# Patient Record
Sex: Male | Born: 1935 | Race: Black or African American | Hispanic: No | Smoking: Former smoker
Health system: Southern US, Community
[De-identification: ages and names within clinical notes are randomized; demographics above are authoritative.]

## PROBLEM LIST (undated history)

## (undated) DIAGNOSIS — I10 Essential (primary) hypertension: Secondary | ICD-10-CM

## (undated) DIAGNOSIS — E079 Disorder of thyroid, unspecified: Secondary | ICD-10-CM

## (undated) DIAGNOSIS — E7800 Pure hypercholesterolemia, unspecified: Secondary | ICD-10-CM

## (undated) DIAGNOSIS — M109 Gout, unspecified: Secondary | ICD-10-CM

## (undated) HISTORY — PX: ABDOMINAL SURGERY: SHX537

---

## 1998-08-25 ENCOUNTER — Ambulatory Visit (HOSPITAL_BASED_OUTPATIENT_CLINIC_OR_DEPARTMENT_OTHER): Admission: RE | Admit: 1998-08-25 | Discharge: 1998-08-25 | Payer: Self-pay | Admitting: Orthopedic Surgery

## 1999-03-04 ENCOUNTER — Encounter (HOSPITAL_BASED_OUTPATIENT_CLINIC_OR_DEPARTMENT_OTHER): Payer: Self-pay | Admitting: General Surgery

## 1999-03-04 ENCOUNTER — Inpatient Hospital Stay (HOSPITAL_COMMUNITY): Admission: EM | Admit: 1999-03-04 | Discharge: 1999-03-08 | Payer: Self-pay | Admitting: General Surgery

## 1999-07-14 ENCOUNTER — Emergency Department (HOSPITAL_COMMUNITY): Admission: EM | Admit: 1999-07-14 | Discharge: 1999-07-14 | Payer: Self-pay | Admitting: Internal Medicine

## 2000-01-26 ENCOUNTER — Emergency Department (HOSPITAL_COMMUNITY): Admission: EM | Admit: 2000-01-26 | Discharge: 2000-01-26 | Payer: Self-pay | Admitting: Internal Medicine

## 2000-03-01 ENCOUNTER — Emergency Department (HOSPITAL_COMMUNITY): Admission: EM | Admit: 2000-03-01 | Discharge: 2000-03-01 | Payer: Self-pay | Admitting: Emergency Medicine

## 2000-03-01 ENCOUNTER — Encounter: Payer: Self-pay | Admitting: Emergency Medicine

## 2000-03-28 ENCOUNTER — Encounter: Payer: Self-pay | Admitting: Vascular Surgery

## 2000-03-29 ENCOUNTER — Ambulatory Visit: Admission: RE | Admit: 2000-03-29 | Discharge: 2000-03-29 | Payer: Self-pay | Admitting: Vascular Surgery

## 2000-04-14 ENCOUNTER — Encounter: Payer: Self-pay | Admitting: Vascular Surgery

## 2000-04-17 ENCOUNTER — Inpatient Hospital Stay (HOSPITAL_COMMUNITY): Admission: RE | Admit: 2000-04-17 | Discharge: 2000-04-21 | Payer: Self-pay | Admitting: Vascular Surgery

## 2000-04-17 ENCOUNTER — Encounter: Payer: Self-pay | Admitting: Vascular Surgery

## 2000-04-17 ENCOUNTER — Encounter (INDEPENDENT_AMBULATORY_CARE_PROVIDER_SITE_OTHER): Payer: Self-pay

## 2000-04-18 ENCOUNTER — Encounter: Payer: Self-pay | Admitting: Vascular Surgery

## 2002-06-12 ENCOUNTER — Ambulatory Visit (HOSPITAL_COMMUNITY): Admission: RE | Admit: 2002-06-12 | Discharge: 2002-06-12 | Payer: Self-pay | Admitting: Dentistry

## 2002-06-12 ENCOUNTER — Encounter (INDEPENDENT_AMBULATORY_CARE_PROVIDER_SITE_OTHER): Payer: Self-pay | Admitting: *Deleted

## 2003-02-12 ENCOUNTER — Emergency Department (HOSPITAL_COMMUNITY): Admission: EM | Admit: 2003-02-12 | Discharge: 2003-02-12 | Payer: Self-pay | Admitting: Emergency Medicine

## 2003-02-12 ENCOUNTER — Encounter: Payer: Self-pay | Admitting: Emergency Medicine

## 2007-03-17 ENCOUNTER — Encounter: Admission: RE | Admit: 2007-03-17 | Discharge: 2007-03-17 | Payer: Self-pay | Admitting: Internal Medicine

## 2008-03-17 ENCOUNTER — Emergency Department (HOSPITAL_COMMUNITY): Admission: EM | Admit: 2008-03-17 | Discharge: 2008-03-17 | Payer: Self-pay | Admitting: Emergency Medicine

## 2008-12-20 ENCOUNTER — Emergency Department (HOSPITAL_COMMUNITY): Admission: EM | Admit: 2008-12-20 | Discharge: 2008-12-20 | Payer: Self-pay | Admitting: Emergency Medicine

## 2008-12-31 ENCOUNTER — Emergency Department (HOSPITAL_COMMUNITY): Admission: EM | Admit: 2008-12-31 | Discharge: 2008-12-31 | Payer: Self-pay | Admitting: Emergency Medicine

## 2011-04-22 NOTE — Op Note (Signed)
[facility]  Patient:    Earl Riley, Earl Riley                        MRN: [id number] Proc. Date: 04/17/00 Adm. Date:  [id number] Attending:  Alyson Locket CC:         Anselmo Rod, M.D.                           Operative Report  PREOPERATIVE DIAGNOSES:  Abdominal aortic aneurysm, bilateral common iliac artery aneurysm.  POSTOPERATIVE DIAGNOSES:  Abdominal aortic aneurysm, bilateral common iliac artery aneurysm.  OPERATION: 1. Aorto bi-external iliac artery bypass with a 16 x 8 Hemashield graft. 2. Ligation of internal iliac arteries bilaterally. 3. Reimplantation of accessory left renal artery. 4. Reimplantation of inferior mesenteric artery.  SURGEON:  Larina Earthly, M.D.  ASSISTANT:  Di Kindle. Edilia Bo, M.D., Lissa Merlin, P.A.C.  ANESTHESIA:  General endotracheal.  COMPLICATIONS:  None.  DISPOSITION:  To recovery when stable.  DESCRIPTION OF PROCEDURE:  The patient was taken to the operating room and placed in position where the area of the abdomen and both groins prepped and draped in the usual sterile fashion.  An incision was made from the level of the xiphoid to pubis, carried down through the midline fascia with electrocautery.  The abdomen was explored.  There was adhesions in the right lower quadrant of the sigmoid into the right lower quadrant.  These were all mobilized.  Omni-Tract retractor was used for exposure.  The transverse colon and antrum reflexed superiorly and the small bowel reflected to the right. The aorta was encircled below the level of the left renal vein.  The patient had a large accessory left renal artery, which was preserved.  The patient also had a large patent inferior mesenteric artery.  Dissection was continued down to the bifurcation.  The common iliac arteries were aneurysmal bilaterally and these extended with large aneurysms into the internal iliac arteries bilaterally.  The external iliac arteries  were normal.  The external iliac arteries were encircled with vessel loops bilaterally.  The patient was given 8000 units of intravenous heparin, 25 g of mannitol.  After adequate circulation time, the aorta was occluded below the level of the renal arteries and above the left accessory renal artery with a Henley clamp.  The internal iliac arteries were ligated bilaterally below the level of the aneurysmal change.  The external iliac arteries were occluded with Henley clamps.  The aorta was opened with electrocautery and lumbar back bleeding was controlled with a 2-0 silk figure-of-eight stitches.  The aorta was transected below the proximal clamp and a 16 x 8 Hemashield graft was brought onto the field and was sewn end-to-end to the aorta with a running 3-0 Prolene suture using a felt strip for reinforcement.  The iliac anastomosis was end-to-end to the external iliac artery bilaterally with 5-0 Prolene sutures.  The usual flushing maneuvers were completed before each anastomosis.  Flow was restored to the lower extremities.  Next, due to ligating the internal iliac arteries, the decision was made to re-implant the inferior mesenteric artery into the graft itself.  This was done by partially occluding the graft with a Satinsky clamp and a ellipse of graft was removed.  The inferior mesenteric artery was sewn end-to-side of the graft with a running 6-0 Prolene suture.  Clamps removed and good flow was noted.  Next, the accessory  left renal artery was approached and was mobilized and was sewn end-to-side to the aortic graft with a running 6-0 Prolene suture.  The clamps were removed and good flow was noted with Doppler signal was noted in the accessory renal and the inferior mesenteric artery.  The patient was given 50 mg of Protamine for reversal of the heparin.  The wounds were irrigated with saline and hemostasis obtained with electrocautery.  The wounds were closed by re-closing the wall  of the aorta over the aortic graft with a running 2-0 Vicryl suture.  Next, the retroperitoneum was closed with a running 2-0 Vicryl suture to exclude the graft from the bowel.  The small bowel was run in its entirety without injury.  It was placed back in the pelvis.  Transverse colon and omentum placed over this.  The midline fascia was closed with #1 PDS suture beginning proximally and distally and tying in the middle.  The skin was closed with skin clips.  A sterile dressing was applied and the patient was taken to the recovery room and extubated in stable condition. DD:  04/17/00 TD:  04/19/00 Job: [id number]

## 2011-04-22 NOTE — Discharge Summary (Signed)
[facility]  Patient:    Earl Riley, Earl Riley                        MRN: [id number] Adm. Date:  [id number] Disc. Date: [id number] Attending:  Alyson Locket Dictator:   Lissa Merlin, P.A. CC:         Larina Earthly, M.D.             Anselmo Rod, M.D.                           Discharge Summary  DATE OF BIRTH:  Nov 14, 1928  SURGEON:  Larina Earthly, M.D.  PRIMARY MEDICAL DOCTOR:  Anselmo Rod, M.D.  ADMISSION DIAGNOSIS: 1. Newly diagnosed 5 cm infrarenal abdominal aortic aneurysm recently    discovered incidentally. 2. Bilateral common iliac artery aneurysms.  PAST MEDICAL HISTORY:  As above.  In addition: 1. History of prostatitis. 2. History of diverticulosis. 3. Gout. 4. Right knee arthritis.  DISCHARGE DIAGNOSES:  Status post ______ of abdominal aortic aneurysm on Apr 17, 2000.  PROCEDURES:  As above.  BRIEF HISTORY:  This is a pleasant 77 year old black male referred by Dr. Loreta Ave for evaluation of AAA.  He initially presented to the [facility] Emergency Department on March 30, 2000 complaining of a "strain."  CT scan at the time revealed an incidental 5 cm infrarenal abdominal aortic aneurysm.  He was referred to Dr. Arbie Cookey for further evaluation.  A follow-up abdominal arteriogram confirmed the AAA as well as bilateral common iliac artery aneurysms which on the right extended into the hypogastric at the takeoff.  It showed a normal femoral arteries and bilateral runoff.  After evaluation by Dr. Arbie Cookey he recommended repair and grafting of the AAA which was scheduled for Apr 17, 2000.  This was after discussion by Dr. Arbie Cookey with Mr. Sivertsen of risks, benefits, details, and alternatives to surgery.  It was agreed to proceed.  Prior to his admission Mr. Mohon denied abdominal pain, nausea, vomiting, diaphoresis, and other abdominal symptoms.  He had no constipation, leg pain, or claudication symptoms.  He was brought into the  hospital for the elective procedure as scheduled on Apr 17, 2000.  He underwent the procedure without obvious complications and was transferred to the recovery room in stable condition.  Postoperative day # 1 he was extubated and observed to be stable.  His vital signs were stable.  He had good saturation on 2 L of O2. Had 2+ dorsalis pedis pulses bilaterally.  He continued to progress well without any notable postoperative complications except some mild nausea postoperative day # 3 which resolved.  He was advanced in his diet and was walking without assistance.  His bowels and bladder were functioning normally. His wound is healing well.  On Apr 21, 2000 he is awake, alert, smiling, and feels well.  He reports he had a good night last night.  He has no more nausea or vomiting.  He was able to keep his supper down last night.  He has been ambulating about the room without assistance.  Latest vital signs show he is afebrile and hemodynamically stable with O2 saturation 91% on room air. Laboratory work is satisfactory this morning.  PHYSICAL EXAMINATION:  LUNGS:  Clear.  HEART:  Regular rate and rhythm.  ABDOMEN:  Soft, nondistended.  Positive bowel sounds.  Incision healing well.  EXTREMITIES:  Dorsalis  pedis pulses 2+ bilaterally with both legs warm.  He is noted to be doing very well today and after breakfast this morning if he continues to do well, he will be discharged home.  MEDICATIONS:  He will resume his home medications which include: 1. Colchicine 0.6 mg b.i.d. 2. Indomethacin 75 mg p.r.n. 3. He will have Tylox for pain one to two p.o. q.4-6h. p.r.n.  ALLERGIES:  No known drug allergies.  SPECIAL INSTRUCTIONS:  Mr. Furey is to resume a low fat, low cholesterol diet.  He is to avoid strenuous activity.  No driving.  He is told to walk daily and to use his incentive spirometer daily.  He can shower.  He is to watch his incision for signs of infection such as redness,  discharge, swelling, and to call the office if he notices any changes.  FOLLOW-UP: 1. Mr. Tavano will return to [facility] office in around two weeks for staple    removal. 2. He will see Dr. Arbie Cookey in around three weeks. DD:  75/18/01 TD:  04/24/00 Job: [id number]

## 2011-04-22 NOTE — Procedures (Signed)
[place]  Patient:    Earl Riley, Earl Riley Visit Number: [id number] MRN: [id number]          Service Type: END Location: ENDO Attending Physician:  Charna Elizabeth Dictated by:   Anselmo Rod, M.D. Proc. Date: 06/12/02 Admit Date:  06/12/2002 Discharge Date: 06/12/2002   CC:         Luisa Hart L. Lurene Shadow, M.D.   Procedure Report  DATE OF BIRTH:  October 24, 1928  PROCEDURE PERFORMED:  Colonoscopy with biopsy x1.  ENDOSCOPIST:  Anselmo Rod, M.D.  INSTRUMENT USED:  Olympus video colonoscope.  INDICATIONS FOR PROCEDURE:  77 year old male with a history of adenomatous polyps removed in the past.  Rule out recurrent polyps.  PREPROCEDURE PREPARATION:  Informed consent was procured from the patient. The patient was fasted for eight hours prior to the procedure and was prepped with a bottle of magnesium citrate and a gallon of NuLYTELY the night prior to the procedure.  PREPROCEDURE PHYSICAL:  VITAL SIGNS:  The patient had stable vital signs.  NECK:  Supple.  LUNGS:  Chest was clear to auscultation.  CARDIAC:  S1, S2 regular.  ABDOMEN:  Abdomen soft with normal bowel sounds.  DESCRIPTION OF PROCEDURE:  The patient was placed in the left lateral decubitus position and sedated with 66 mg of Demerol and 6.5 mg of Versed intravenously.  Once the patient was adequately sedated and maintained on low-flow oxygen and continuous cardiac monitoring, the Olympus video colonoscope was advanced from the rectum to the cecum with slight difficulty, secondary to some residual stool in the colon.  Multiple washings were done. A small sessile polyp was biopsied from the proximal right colon.  There were a few scattered diverticula with more prominent changes in the right than the left colon with regards to diverticular disease.  Small internal hemorrhoids were appreciated on retroflexion in the rectum.  IMPRESSION: 1. Small sessile polyp, biopsied from  proximal right colon. 2. Scattered diverticulosis with more prominent changes in the right colon    than the left colon. 3. Small nonbleeding internal hemorrhoids. 4. Normal-appearing terminal ileum.  RECOMMENDATIONS: 1. Await pathology results. 2. High-fiber diet has been recommended for the patient with liberal    fluid intake. 3. Outpatient followup in the next two weeks. Dictated by:   Anselmo Rod, M.D. Attending Physician:  Charna Elizabeth DD:  06/12/02 TD:  06/16/02 Job: [id number]

## 2011-04-22 NOTE — H&P (Signed)
[facility]  Patient:    Earl Riley, Earl Riley                          MRN: [id number] Adm. Date:  04/17/00 Attending:  Larina Earthly, M.D. Dictator:   Marlowe Kays, P.A. CC:         Anselmo Rod, M.D.                         History and Physical  DATE OF BIRTH:  09/02/2028.  REFERRING PHYSICIAN:  Anselmo Rod, M.D.  CHIEF COMPLAINT:  Abdominal aortic aneurysm.  HISTORY OF PRESENT ILLNESS:  A 77 year old white male referred by Dr. Loreta Ave for evaluation of AAA.  The patient presented to the [facility] ED on March 30, 2000, complaining of "strain."  A CT scan revealed an incidental 5 cm infrarenal AAA.  He then was referred to Dr. Arbie Cookey for further evaluation. Follow-up abdominal arteriogram confirmed the AAA, as well as bilateral CIA aneurysm, which on the right extends into the hypogastric at the takeoff.  He also shows normal femorals and bilateral runoff.  Upon evaluation, Dr. Arbie Cookey recommended repair and graft of the AAA, which is scheduled for Apr 17, 2000. Prior to the admission, the patient denies any abdominal pain, nausea, vomiting, diaphoresis, hematochezia, diarrhea, melena, fever, or chills.  No back pain, constipation, leg pain, claudication symptoms, hematemesis, shortness of breath, or dyspnea on exertion.  PAST MEDICAL HISTORY:  History of prostatitis, history of diverticulosis, gout, and arthritis of the right knee.  PAST SURGICAL HISTORY:  Status post aortogram with bilateral lower extremity runoff by Dr. Arbie Cookey on March 29, 2000.  MEDICATIONS:  Colchicine 0.6 mg p.o. b.i.d., indomethacin 75 mg one p.o. p.r.n.  ALLERGIES:  No known drug allergies.  REVIEW OF SYSTEMS:  See HPI and past medical history for significant positives.  The patient denies any diabetes, kidney disease, asthma, COPD, TIA, CVA, amaurosis fugax, syncope, presyncope, CAD, angina, arrhythmia, PE, DVT, GI bleed, dysuria, hematuria, GERD, CHF, or  hypertension.  FAMILY HISTORY:  Significant for mother, who died at 3 of hypertension, and father died at 25 of "old age."  He has two brothers and two sisters, alive and well.  SOCIAL HISTORY:  The patient is widowed for three years, he has no children. 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  He denies any tobacco intake, he quit 30 years ago one-half pack of cigarettes for 20 years of use.  No alcohol intake.  PHYSICAL EXAMINATION:  GENERAL:  Well-developed, well-nourished 77 year old white male in no acute distress.  Alert and oriented x 3.  VITAL SIGNS:  Blood pressure 140/90 on the left, 140/92 on the right, pulse 64, respirations 18.  HEENT:  Normocephalic, atraumatic, PERRLA, EOMI, bilateral, _____ funduscopic exam.  The patient was wears glasses.  NECK:  Supple, no JVD, bruits, thyromegaly, or lymphadenopathy.  CHEST:  Symmetric on inspiration.  Lungs clear to auscultation bilaterally, decreased breath sounds at the bases.  CARDIOVASCULAR:  Regular rate and rhythm without murmurs, rubs, or gallops.  ABDOMEN:  Soft, nontender, bowel sounds x 4, no hepatosplenomegaly.  There is a nontender pulsatile mass, with a 1/4 bruit, in the periumbilical area.  GU/RECTAL:  Deferred.  EXTREMITIES:  No clubbing, cyanosis, or edema.  SKIN:  No ulcerations, normal capillary pattern, warm temperature.  PULSES:  Carotids 2+ bilaterally without bruits, femorals 2+ bilaterally, popliteal, dorsalis pedis, and  posterior tibial are 2+ bilaterally.  NEUROLOGIC:  Grossly normal, normal gait.  DTRs 2+ bilaterally.  Muscle strength 5/5.  ASSESSMENT AND PLAN:  A 77 year old black male with a newly diagnosed abdominal aortic aneurysm, who will undergo repair and graft of the abdominal aortic aneurysm with Dr. Arbie Cookey on Apr 17, 2000.   Dr. Arbie Cookey has seen and evaluated this patient prior to the admission.  He has explained the risks and benefits involving the procedure, and the patient  has agreed to continue. DD:  04/20/00 TD:  04/20/00 Job: [id number]

## 2011-08-30 LAB — CBC
HCT: 42.7
Hemoglobin: 14.2
MCHC: 33.1
RDW: 13.7

## 2011-08-30 LAB — DIFFERENTIAL
Basophils Absolute: 0.1
Basophils Relative: 0
Monocytes Relative: 8
Neutro Abs: 8.5 — ABNORMAL HIGH
Neutrophils Relative %: 71

## 2011-08-30 LAB — COMPREHENSIVE METABOLIC PANEL
Alkaline Phosphatase: 94
BUN: 15
CO2: 24
GFR calc non Af Amer: 41 — ABNORMAL LOW
Glucose, Bld: 136 — ABNORMAL HIGH
Potassium: 3.8
Total Protein: 8.4 — ABNORMAL HIGH

## 2011-08-30 LAB — OCCULT BLOOD X 1 CARD TO LAB, STOOL: Fecal Occult Bld: POSITIVE

## 2011-09-01 ENCOUNTER — Emergency Department (HOSPITAL_COMMUNITY)
Admission: EM | Admit: 2011-09-01 | Discharge: 2011-09-01 | Disposition: A | Discharge status: Home / Self Care | Payer: Medicare Other | Attending: Emergency Medicine | Admitting: Emergency Medicine

## 2011-09-01 ENCOUNTER — Emergency Department (HOSPITAL_COMMUNITY): Payer: Medicare Other

## 2011-09-01 DIAGNOSIS — I10 Essential (primary) hypertension: Secondary | ICD-10-CM | POA: Insufficient documentation

## 2011-09-01 DIAGNOSIS — IMO0002 Reserved for concepts with insufficient information to code with codable children: Secondary | ICD-10-CM | POA: Insufficient documentation

## 2011-09-01 DIAGNOSIS — E7800 Pure hypercholesterolemia, unspecified: Secondary | ICD-10-CM | POA: Insufficient documentation

## 2011-09-01 DIAGNOSIS — E039 Hypothyroidism, unspecified: Secondary | ICD-10-CM | POA: Insufficient documentation

## 2011-09-01 LAB — CBC
MCV: 95.1 fL (ref 78.0–100.0)
Platelets: 202 10*3/uL (ref 150–400)
RBC: 4.07 MIL/uL — ABNORMAL LOW (ref 4.22–5.81)
WBC: 6.6 10*3/uL (ref 4.0–10.5)

## 2011-10-25 ENCOUNTER — Other Ambulatory Visit: Payer: Self-pay | Admitting: Gastroenterology

## 2015-08-21 ENCOUNTER — Emergency Department (HOSPITAL_COMMUNITY): Payer: Medicare HMO

## 2015-08-21 ENCOUNTER — Encounter (HOSPITAL_COMMUNITY): Payer: Self-pay | Admitting: *Deleted

## 2015-08-21 ENCOUNTER — Observation Stay (HOSPITAL_COMMUNITY)
Admission: EM | Admit: 2015-08-21 | Discharge: 2015-08-22 | Disposition: A | Discharge status: Home / Self Care | Payer: Medicare HMO | Attending: Emergency Medicine | Admitting: Emergency Medicine

## 2015-08-21 DIAGNOSIS — E780 Pure hypercholesterolemia: Secondary | ICD-10-CM | POA: Insufficient documentation

## 2015-08-21 DIAGNOSIS — M109 Gout, unspecified: Secondary | ICD-10-CM | POA: Insufficient documentation

## 2015-08-21 DIAGNOSIS — R079 Chest pain, unspecified: Principal | ICD-10-CM | POA: Insufficient documentation

## 2015-08-21 DIAGNOSIS — E079 Disorder of thyroid, unspecified: Secondary | ICD-10-CM | POA: Diagnosis not present

## 2015-08-21 DIAGNOSIS — E785 Hyperlipidemia, unspecified: Secondary | ICD-10-CM | POA: Diagnosis not present

## 2015-08-21 DIAGNOSIS — I10 Essential (primary) hypertension: Secondary | ICD-10-CM | POA: Diagnosis not present

## 2015-08-21 DIAGNOSIS — R072 Precordial pain: Secondary | ICD-10-CM | POA: Diagnosis not present

## 2015-08-21 HISTORY — DX: Disorder of thyroid, unspecified: E07.9

## 2015-08-21 HISTORY — DX: Gout, unspecified: M10.9

## 2015-08-21 HISTORY — DX: Pure hypercholesterolemia, unspecified: E78.00

## 2015-08-21 HISTORY — DX: Essential (primary) hypertension: I10

## 2015-08-21 LAB — BASIC METABOLIC PANEL
Anion gap: 7 (ref 5–15)
BUN: 15 mg/dL (ref 6–20)
CALCIUM: 9.7 mg/dL (ref 8.9–10.3)
CO2: 25 mmol/L (ref 22–32)
CREATININE: 1.49 mg/dL — AB (ref 0.61–1.24)
Chloride: 106 mmol/L (ref 101–111)
GFR calc non Af Amer: 41 mL/min — ABNORMAL LOW (ref >60)
GFR, EST AFRICAN AMERICAN: 48 mL/min — AB (ref >60)
GLUCOSE: 95 mg/dL (ref 65–99)
Potassium: 4.2 mmol/L (ref 3.5–5.1)
Sodium: 138 mmol/L (ref 135–145)

## 2015-08-21 LAB — URINALYSIS, ROUTINE W REFLEX MICROSCOPIC
BILIRUBIN URINE: NEGATIVE
GLUCOSE, UA: NEGATIVE mg/dL
HGB URINE DIPSTICK: NEGATIVE
Ketones, ur: NEGATIVE mg/dL
Nitrite: NEGATIVE
PH: 6 (ref 5.0–8.0)
Protein, ur: NEGATIVE mg/dL
SPECIFIC GRAVITY, URINE: 1.008 (ref 1.005–1.030)
Urobilinogen, UA: 0.2 mg/dL (ref 0.0–1.0)

## 2015-08-21 LAB — URINE MICROSCOPIC-ADD ON

## 2015-08-21 LAB — CBC
HCT: 40 % (ref 39.0–52.0)
Hemoglobin: 13.1 g/dL (ref 13.0–17.0)
MCH: 31 pg (ref 26.0–34.0)
MCHC: 32.8 g/dL (ref 30.0–36.0)
MCV: 94.6 fL (ref 78.0–100.0)
PLATELETS: 154 10*3/uL (ref 150–400)
RBC: 4.23 MIL/uL (ref 4.22–5.81)
RDW: 13.4 % (ref 11.5–15.5)
WBC: 6.3 10*3/uL (ref 4.0–10.5)

## 2015-08-21 LAB — I-STAT TROPONIN, ED
TROPONIN I, POC: 0 ng/mL (ref 0.00–0.08)
Troponin i, poc: 0 ng/mL (ref 0.00–0.08)

## 2015-08-21 LAB — TROPONIN I: Troponin I: <0.03 ng/mL (ref <0.031)

## 2015-08-21 MED ORDER — ENOXAPARIN SODIUM 40 MG/0.4ML ~~LOC~~ SOLN
40.0000 mg | SUBCUTANEOUS | Status: DC
Start: 2015-08-21 — End: 2015-08-22

## 2015-08-21 MED ORDER — ACETAMINOPHEN 650 MG RE SUPP: 650.0000 mg | Freq: Four times a day (QID) | RECTAL | Status: DC | PRN

## 2015-08-21 MED ORDER — LISINOPRIL 10 MG PO TABS
10.0000 mg | ORAL_TABLET | Freq: Every day | ORAL | Status: DC
  Administered 2015-08-21 – 2015-08-22 (×2): 10 mg via ORAL
  Filled 2015-08-21: qty 1

## 2015-08-21 MED ORDER — AMLODIPINE BESYLATE 5 MG PO TABS
5.0000 mg | ORAL_TABLET | Freq: Every day | ORAL | Status: DC
  Administered 2015-08-21 – 2015-08-22 (×2): 5 mg via ORAL
  Filled 2015-08-21: qty 1

## 2015-08-21 MED ORDER — SIMVASTATIN 20 MG PO TABS
20.0000 mg | ORAL_TABLET | Freq: Every day | ORAL | Status: DC
  Administered 2015-08-21: 20 mg via ORAL

## 2015-08-21 MED ORDER — ASPIRIN 81 MG PO CHEW
243.0000 mg | CHEWABLE_TABLET | Freq: Once | ORAL | Status: AC
Start: 2015-08-21 — End: 2015-08-21
  Administered 2015-08-21: 243 mg via ORAL
  Filled 2015-08-21: qty 3

## 2015-08-21 MED ORDER — SODIUM CHLORIDE 0.9 % IJ SOLN
3.0000 mL | Freq: Two times a day (BID) | INTRAMUSCULAR | Status: DC
Start: 2015-08-21 — End: 2015-08-22
  Administered 2015-08-21 – 2015-08-22 (×2): 3 mL via INTRAVENOUS

## 2015-08-21 MED ORDER — LEVOTHYROXINE SODIUM 100 MCG PO TABS
100.0000 ug | ORAL_TABLET | Freq: Every day | ORAL | Status: DC
  Administered 2015-08-22: 100 ug via ORAL
  Filled 2015-08-21: qty 1

## 2015-08-21 MED ORDER — ASPIRIN EC 325 MG PO TBEC
325.0000 mg | DELAYED_RELEASE_TABLET | Freq: Every day | ORAL | Status: DC
  Administered 2015-08-22: 325 mg via ORAL
  Filled 2015-08-21: qty 1

## 2015-08-21 MED ORDER — NITROGLYCERIN 0.4 MG SL SUBL
0.4000 mg | SUBLINGUAL_TABLET | SUBLINGUAL | Status: AC | PRN
  Administered 2015-08-21 (×3): 0.4 mg via SUBLINGUAL
  Filled 2015-08-21: qty 1

## 2015-08-21 MED ORDER — IOHEXOL 350 MG/ML SOLN
100.0000 mL | Freq: Once | INTRAVENOUS | Status: AC | PRN
  Administered 2015-08-21: 100 mL via INTRAVENOUS

## 2015-08-21 MED ORDER — ACETAMINOPHEN 325 MG PO TABS
650.0000 mg | ORAL_TABLET | Freq: Four times a day (QID) | ORAL | Status: DC | PRN
  Administered 2015-08-21: 650 mg via ORAL
  Filled 2015-08-21: qty 2

## 2015-08-21 NOTE — ED Notes (Signed)
Patient undressed, in gown, on monitor, continuous pulse oximetry and blood pressure cuff 

## 2015-08-21 NOTE — ED Notes (Signed)
Patient has returned from being out of the department; patient placed back on monitor, continuous pulse oximetry and blood pressure cuff 

## 2015-08-21 NOTE — ED Notes (Signed)
patient is out of the department for testing at this time

## 2015-08-21 NOTE — ED Provider Notes (Signed)
CSN: [id number]     Arrival date & time 08/21/15  1050 History   First MD Initiated Contact with Patient 08/21/15 1144     Chief Complaint  Patient presents with  . Chest Pain     (Consider location/radiation/quality/duration/timing/severity/associated sxs/prior Treatment) HPI   Blood pressure 134/84, pulse 62, temperature 97.6 F (36.4 C), temperature source Oral, resp. rate 20, height 5' 9.5" (1.765 m), weight 187 lb 8 oz (85.049 kg), SpO2 100 %.  Earl Riley is a 81 y.o. male complaining of left-sided CP radiating to left shoulder and back associated with shortness of breath onset 2 days ago. Rates his pain as 8 out of 10 and describes it like someone is punching him. Patient took ibuprofen with some relief yesterday. States it's exacerbated by exertion. Patient is associated productive cough but denies fever, chills, increasing peripheral edema, history of any cardiac issues, history of tobacco use however he smokes marijuana approximately 5 times per week. Patient takes a daily low-dose aspirin, he had 81 mg this morning.   Past Medical History  Diagnosis Date  . Gout   . Hypertension   . Thyroid disease   . Hypercholesteremia    Past Surgical History  Procedure Laterality Date  . Abdominal surgery      aneurism repair   No family history on file. Social History  Substance Use Topics  . Smoking status: Never Smoker   . Smokeless tobacco: None  . Alcohol Use: No    Review of Systems  10 systems reviewed and found to be negative, except as noted in the HPI.   Allergies  Review of patient's allergies indicates no known allergies.  Home Medications   Prior to Admission medications   Not on File   BP 130/84 mmHg  Pulse 77  Temp(Src) 97.6 F (36.4 C) (Oral)  Resp 22  Ht 5' 9.5" (1.765 m)  Wt 187 lb 8 oz (85.049 kg)  BMI 27.30 kg/m2  SpO2 97% Physical Exam  Constitutional: He is oriented to person, place, and time. He appears well-developed and  well-nourished. No distress.  Very well appearing  HENT:  Head: Normocephalic.  Mouth/Throat: Oropharynx is clear and moist.  Eyes: Conjunctivae are normal.  Neck: Normal range of motion. No JVD present. No tracheal deviation present.  Cardiovascular: Normal rate, regular rhythm and intact distal pulses.   Radial pulse equal bilaterally  Pulmonary/Chest: Effort normal and breath sounds normal. No stridor. No respiratory distress. He has no wheezes. He has no rales. He exhibits no tenderness.  Abdominal: Soft. Bowel sounds are normal. He exhibits no distension and no mass. There is no tenderness. There is no rebound and no guarding.  Musculoskeletal: Normal range of motion. He exhibits no edema or tenderness.  Tophaceous gout to bilateral hands and right elbow, no overlying skin changes or warmth, nontender to palpation.  Neurological: He is alert and oriented to person, place, and time.  Skin: Skin is warm. He is not diaphoretic.  Psychiatric: He has a normal mood and affect.  Nursing note and vitals reviewed.   ED Course  Procedures (including critical care time) Labs Review Labs Reviewed  BASIC METABOLIC PANEL - Abnormal; Notable for the following:    Creatinine, Ser 1.49 (*)    GFR calc non Af Amer 41 (*)    GFR calc Af Amer 48 (*)    All other components within normal limits  URINALYSIS, ROUTINE W REFLEX MICROSCOPIC (NOT AT [facility]) - Abnormal; Notable for the following:  Leukocytes, UA SMALL (*)    All other components within normal limits  URINE MICROSCOPIC-ADD ON - Abnormal; Notable for the following:    Bacteria, UA MANY (*)    All other components within normal limits  CBC  I-STAT TROPOININ, ED    Imaging Review Dg Chest 2 View  08/21/2015   CLINICAL DATA:  Shortness of breath.  EXAM: CHEST  2 VIEW  COMPARISON:  December 20, 2008.  FINDINGS: The heart size and mediastinal contours are within normal limits. Both lungs are clear. No pneumothorax or pleural effusion is  noted. Mild multilevel degenerative disc disease is noted in the thoracic spine.  IMPRESSION: No active cardiopulmonary disease.   Electronically Signed   By: Lupita Raider, M.D.   On: 08/21/2015 11:33   I have personally reviewed and evaluated these images and lab results as part of my medical decision-making.   EKG Interpretation   Date/Time:  Friday August 21 2015 10:56:31 EDT Ventricular Rate:  81 PR Interval:  188 QRS Duration: 84 QT Interval:  358 QTC Calculation: 415 R Axis:   25 Text Interpretation:  Normal sinus rhythm Normal ECG Confirmed by RAY MD,  DANIELLE ([id number]) on 08/21/2015 1:11:05 PM      MDM   Final diagnoses:  Chest pain, unspecified chest pain type    Filed Vitals:   08/21/15 1315 08/21/15 1330 08/21/15 1345 08/21/15 1524  BP: 131/80 136/82 130/86 130/84  Pulse: 58 56 66 77  Temp:      TempSrc:      Resp: Height:      Weight:      SpO2: 98% 97% 98% 97%    Medications  aspirin chewable tablet 243 mg (243 mg Oral Given 08/21/15 1236)  nitroGLYCERIN (NITROSTAT) SL tablet 0.4 mg (0.4 mg Sublingual Given 08/21/15 1441)  iohexol (OMNIPAQUE) 350 MG/ML injection 100 mL (100 mLs Intravenous Contrast Given 08/21/15 1509)    Earl Riley is a pleasant 81 y.o. male presenting with chest pain and shortness of breath onset 2 days ago. Patient states that this is exertional. He is moderate risk by heart score, based on his smoking, hypertension and age.  EKG is nonischemic, troponin is negative. Patient does not appear clinically volume overloaded. No history of cardiac issues. Patient well be given a CT chest to evaluate both his aorta and PE.   CT angiogram of chest and abdomen are negative. Patient will need admission for chest pain rule out. Case discussed with triad hospitalist Dr. York Spaniel of who accepts admission.       Wynetta Emery, PA-C 08/21/15 1634  Margarita Grizzle, MD 08/23/15 1101

## 2015-08-21 NOTE — H&P (Signed)
[facility] History and Physical  BRENDAN GADSON ZOX:[id number] DOB: 04/24/1928 DOA: 08/21/2015  Referring physician:  PCP: Dorrene German, MD  Specialists:   Chief Complaint: chest pain  HPI: Earl Riley is a 81 y.o. male with PMH of HTN, HPL, PAD, CKD, Chronic Cannabis Use, Hypothyroidism is presented with chest pains. Patient is complaining of left sided 8/10 chest pains radiating to his left arm and back. He also reports mild shortness of breath assisociated with chest pains. He denies exertional symptoms. No diaphoresis. No cough, non fever,chills, no nausea, vomiting no abdominal pains.    Review of Systems: The patient denies anorexia, fever, weight loss,, vision loss, decreased hearing, hoarseness, syncope, dyspnea on exertion, peripheral edema, balance deficits, hemoptysis, abdominal pain, melena, hematochezia, severe indigestion/heartburn, hematuria, incontinence, genital sores, muscle weakness, suspicious skin lesions, transient blindness, difficulty walking, depression, unusual weight change, abnormal bleeding, enlarged lymph nodes, angioedema, and breast masses.    Past Medical History  Diagnosis Date  . Gout   . Hypertension   . Thyroid disease   . Hypercholesteremia    Past Surgical History  Procedure Laterality Date  . Abdominal surgery      aneurism repair   Social History:  reports that he has never smoked. He does not have any smokeless tobacco history on file. He reports that he uses illicit drugs (Marijuana). He reports that he does not drink alcohol. Home;  where does patient live--home, ALF, SNF? and with whom if at home? Yes;  Can patient participate in ADLs?  No Known Allergies  No family history on file. denies h/o CAD. No CVA (be sure to complete)  Prior to Admission medications   Not on File   Physical Exam: Filed Vitals:   08/21/15 1630  BP: 138/106  Pulse: 101  Temp:   Resp: 25     General:  Alert. No distress   Eyes:  eom-i  ENT: no oral ulcers   Neck: supple, no JVD  Cardiovascular: s1,s2 rrr  Respiratory: CTA BL  Abdomen: soft, nt,nd   Skin: no rash   Musculoskeletal: s1,s2 rrr  Psychiatric: no hallucinations   Neurologic: CN 2-12 intact. Motor 5/5 BL  Labs on Admission:  Basic Metabolic Panel:  Recent Labs Lab 08/21/15 1203  NA 138  K 4.2  CL 106  CO2 25  GLUCOSE 95  BUN 15  CREATININE 1.49*  CALCIUM 9.7   Liver Function Tests: No results for input(s): AST, ALT, ALKPHOS, BILITOT, PROT, ALBUMIN in the last 168 hours. No results for input(s): LIPASE, AMYLASE in the last 168 hours. No results for input(s): AMMONIA in the last 168 hours. CBC:  Recent Labs Lab 08/21/15 1203  WBC 6.3  HGB 13.1  HCT 40.0  MCV 94.6  PLT 154   Cardiac Enzymes: No results for input(s): CKTOTAL, CKMB, CKMBINDEX, TROPONINI in the last 168 hours.  BNP (last 3 results) No results for input(s): BNP in the last 8760 hours.  ProBNP (last 3 results) No results for input(s): PROBNP in the last 8760 hours.  CBG: No results for input(s): GLUCAP in the last 168 hours.  Radiological Exams on Admission: Dg Chest 2 View  08/21/2015   CLINICAL DATA:  Shortness of breath.  EXAM: CHEST  2 VIEW  COMPARISON:  December 20, 2008.  FINDINGS: The heart size and mediastinal contours are within normal limits. Both lungs are clear. No pneumothorax or pleural effusion is noted. Mild multilevel degenerative disc disease is noted in the thoracic spine.  IMPRESSION:  No active cardiopulmonary disease.   Electronically Signed   By: Lupita Raider, M.D.   On: 08/21/2015 11:33   Ct Angio Chest Aorta W/cm &/or Wo/cm  08/21/2015   CLINICAL DATA:  Persistent subacute chest pain since Wednesday night starting in the center of the back and radiating to left arm. History of hypertension. Prior abdominal aortic aneurysm repair.  EXAM: CT ANGIOGRAPHY CHEST, ABDOMEN AND PELVIS  TECHNIQUE: Multidetector CT imaging through the  chest, abdomen and pelvis was performed using the standard protocol during bolus administration of intravenous contrast. Multiplanar reconstructed images and MIPs were obtained and reviewed to evaluate the vascular anatomy.  CONTRAST:  OMNIPAQUE IOHEXOL 350 MG/ML SOLN  COMPARISON:  08/21/2015 chest x-ray  FINDINGS: CTA CHEST FINDINGS  Initial noncontrast imaging of the chest demonstrates aneurysm dilatation and atherosclerosis of the proximal descending thoracic aorta. No hyperdense intramural hematoma appreciated. No mediastinal hemorrhage or hematoma. Post contrast, the major branch vessels are tortuous but remain patent. Intact thoracic aorta without dissection. Mild aneurysmal dilatation of the proximal descending thoracic aorta, maximal diameter 39 mm.  No adenopathy. Normal heart size. No pericardial or pleural effusion.  Lung windows demonstrate mild upper lobe paraseptal emphysema peripherally. Scattered areas of subpleural parenchymal scarring and atelectasis in the lower lobes. No acute airspace process, definite pneumonia, significant collapse or consolidation. No pneumothorax. Trachea and central airways are patent.  Review of the MIP images confirms the above findings.  CTA ABDOMEN AND PELVIS FINDINGS  Intact abdominal aorta. Status post distal aorta bi-femoral repair. The celiac, SMA, main renal arteries, and IMA all remain patent. Iliac bypass limbs remain patent. The common femoral, proximal profunda femoral, and proximal superficial femoral arteries demonstrated are also patent. No acute vascular process, hemorrhage, dissection, or occlusive process.  Reconstitution proximal left internal iliac artery demonstrates mild aneurysmal dilatation measuring 18 mm, image 24.  Nonvascular: Right hepatic dome 10 mm hypodensity noted, image 99 suspect small hepatic cyst. Other scattered hypodensities throughout the liver noted, suspect hepatic cysts. No biliary dilatation or obstruction. Gallbladder,  biliary system, pancreas, spleen, adrenal glands within normal limits for age and demonstrate no acute process.  Scattered numerous hypodense renal cysts noted, largest in the right upper pole measures 4 cm, image 145. 10 mm hyperdense left renal cyst noted in the midpole, image 151.  Negative for bowel obstruction, dilatation, ileus, or free air. Scattered colonic diverticulosis evident. Normal appendix in the right lower quadrant. No abdominal or pelvic free fluid, fluid collection, hemorrhage, abscess, or adenopathy.  Bones are osteopenic. Degenerative changes of the spine diffusely. No acute osseous finding.  Review of the MIP images confirms the above findings.  IMPRESSION: Negative for acute aortic dissection.  Thoracic aortic mild aneurysmal dilatation, atherosclerosis, tortuosity as above.  Status post infrarenal aorto bi femoral bypass without acute vascular process, dissection, occlusion or hemorrhage.  Hepatic and bilateral renal cysts as described  Colonic diverticulosis   Electronically Signed   By: Judie Petit.  Shick M.D.   On: 08/21/2015 15:56   Ct Cta Abd/pel W/cm &/or W/o Cm  08/21/2015   CLINICAL DATA:  Persistent subacute chest pain since Wednesday night starting in the center of the back and radiating to left arm. History of hypertension. Prior abdominal aortic aneurysm repair.  EXAM: CT ANGIOGRAPHY CHEST, ABDOMEN AND PELVIS  TECHNIQUE: Multidetector CT imaging through the chest, abdomen and pelvis was performed using the standard protocol during bolus administration of intravenous contrast. Multiplanar reconstructed images and MIPs were obtained and reviewed to evaluate  the vascular anatomy.  CONTRAST:  OMNIPAQUE IOHEXOL 350 MG/ML SOLN  COMPARISON:  08/21/2015 chest x-ray  FINDINGS: CTA CHEST FINDINGS  Initial noncontrast imaging of the chest demonstrates aneurysm dilatation and atherosclerosis of the proximal descending thoracic aorta. No hyperdense intramural hematoma appreciated. No  mediastinal hemorrhage or hematoma. Post contrast, the major branch vessels are tortuous but remain patent. Intact thoracic aorta without dissection. Mild aneurysmal dilatation of the proximal descending thoracic aorta, maximal diameter 39 mm.  No adenopathy. Normal heart size. No pericardial or pleural effusion.  Lung windows demonstrate mild upper lobe paraseptal emphysema peripherally. Scattered areas of subpleural parenchymal scarring and atelectasis in the lower lobes. No acute airspace process, definite pneumonia, significant collapse or consolidation. No pneumothorax. Trachea and central airways are patent.  Review of the MIP images confirms the above findings.  CTA ABDOMEN AND PELVIS FINDINGS  Intact abdominal aorta. Status post distal aorta bi-femoral repair. The celiac, SMA, main renal arteries, and IMA all remain patent. Iliac bypass limbs remain patent. The common femoral, proximal profunda femoral, and proximal superficial femoral arteries demonstrated are also patent. No acute vascular process, hemorrhage, dissection, or occlusive process.  Reconstitution proximal left internal iliac artery demonstrates mild aneurysmal dilatation measuring 18 mm, image 24.  Nonvascular: Right hepatic dome 10 mm hypodensity noted, image 99 suspect small hepatic cyst. Other scattered hypodensities throughout the liver noted, suspect hepatic cysts. No biliary dilatation or obstruction. Gallbladder, biliary system, pancreas, spleen, adrenal glands within normal limits for age and demonstrate no acute process.  Scattered numerous hypodense renal cysts noted, largest in the right upper pole measures 4 cm, image 145. 10 mm hyperdense left renal cyst noted in the midpole, image 151.  Negative for bowel obstruction, dilatation, ileus, or free air. Scattered colonic diverticulosis evident. Normal appendix in the right lower quadrant. No abdominal or pelvic free fluid, fluid collection, hemorrhage, abscess, or adenopathy.  Bones  are osteopenic. Degenerative changes of the spine diffusely. No acute osseous finding.  Review of the MIP images confirms the above findings.  IMPRESSION: Negative for acute aortic dissection.  Thoracic aortic mild aneurysmal dilatation, atherosclerosis, tortuosity as above.  Status post infrarenal aorto bi femoral bypass without acute vascular process, dissection, occlusion or hemorrhage.  Hepatic and bilateral renal cysts as described  Colonic diverticulosis   Electronically Signed   By: Judie Petit.  Shick M.D.   On: 08/21/2015 15:56    EKG: Independently reviewed.   Assessment/Plan Active Problems:   Chest pain  81 y.o. male with PMH of HTN, HPL, PAD, CKD, Gout, Chronic Cannabis Use, Hypothyroidism is presented with chest pains  1. Chest pains. Atypical. Patient is not in acute pain. Initial ECG/trop unremarkable.  -we will cont ASA, statins. Start BB if tolerated. Obtain serial ECG/trop. Consulted cardiology for stress test evaluation. NPO after midnight    2. HTN. Resume amlodipine, lisinopril. Titrate as needed.  3. HPL on statin 4. Gout. Chronic no acute exacerbation. Chronic arthropathy     Cardiology.  if consultant consulted, please document name and whether formally or informally consulted  Code Status: full (must indicate code status--if unknown or must be presumed, indicate so) Family Communication: d/w patient  (indicate person spoken with, if applicable, with phone number if by telephone) Disposition Plan: home pend improvement  (indicate anticipated LOS)  Time spent: >45 minutes   Esperanza Sheets [facility] Pager [phone]  If 7PM-7AM, please contact night-coverage [email or web address] Password [email or web address] 08/21/2015, 4:48 PM

## 2015-08-21 NOTE — Consult Note (Addendum)
CARDIOLOGY CONSULT NOTE   Patient ID: Earl Riley MRN: [id number], DOB/AGE: 1928-04-18   Admit date: 08/21/2015 Date of Consult: 08/21/2015  Primary Physician: Dorrene German, MD Primary Cardiologist: New patient  Reason for consult:  Chest pain  Problem List  Past Medical History  Diagnosis Date  . Gout   . Hypertension   . Thyroid disease   . Hypercholesteremia     Past Surgical History  Procedure Laterality Date  . Abdominal surgery      aneurism repair    Allergies  No Known Allergies  HPI   Earl Riley is a 81 y.o. male with PMH of HTN, HPL, PAD, CKD, Chronic Cannabis Use, Hypothyroidism is presented with back pai radiating to his chest wall. 8/10 sharp, not elated to exertion. He states that he dances all the time and hasn't noticed any CP, DOE or fatigue. He is currently asymptomatic. He denies palpitations or syncope. No diaphoresis. No cough, non fever,chills, no nausea, vomiting no abdominal pains.  He quit smoking 30 years ago. There is no FH of premature CAD.  Inpatient Medications  Family History No family history on file.   Social History Social History   Social History  . Marital Status: Widowed    Spouse Name: N/A  . Number of Children: N/A  . Years of Education: N/A   Occupational History  . Not on file.   Social History Main Topics  . Smoking status: Never Smoker   . Smokeless tobacco: Not on file  . Alcohol Use: No  . Drug Use: Yes    Special: Marijuana  . Sexual Activity: Not on file   Other Topics Concern  . Not on file   Social History Narrative  . No narrative on file     Review of Systems  General:  No chills, fever, night sweats or weight changes.  Cardiovascular:  No chest pain, dyspnea on exertion, edema, orthopnea, palpitations, paroxysmal nocturnal dyspnea. Dermatological: No rash, lesions/masses Respiratory: No cough, dyspnea Urologic: No hematuria, dysuria Abdominal:   No nausea, vomiting, diarrhea,  bright red blood per rectum, melena, or hematemesis Neurologic:  No visual changes, wkns, changes in mental status. All other systems reviewed and are otherwise negative except as noted above.  Physical Exam  Blood pressure 138/106, pulse 101, temperature 97.6 F (36.4 C), temperature source Oral, resp. rate 25, height 5' 9.5" (1.765 m), weight 187 lb 8 oz (85.049 kg), SpO2 91 %.  General: Pleasant, NAD Psych: Normal affect. Neuro: Alert and oriented X 3. Moves all extremities spontaneously. HEENT: Normal  Neck: Supple without bruits or JVD. Lungs:  Resp regular and unlabored, CTA. Heart: RRR no s3, s4, or murmurs. Abdomen: Soft, non-tender, non-distended, BS + x 4.  Extremities: No clubbing, cyanosis or edema. DP/PT/Radials 2+ and equal bilaterally. Significant gout arthritis in multiple joints inlcuding elbows, wrists.  Labs  No results for input(s): CKTOTAL, CKMB, TROPONINI in the last 72 hours. Lab Results  Component Value Date   WBC 6.3 08/21/2015   HGB 13.1 08/21/2015   HCT 40.0 08/21/2015   MCV 94.6 08/21/2015   PLT 154 08/21/2015    Recent Labs Lab 08/21/15 1203  NA 138  K 4.2  CL 106  CO2 25  BUN 15  CREATININE 1.49*  CALCIUM 9.7  GLUCOSE 95   Radiology/Studies  Dg Chest 2 View  08/21/2015   CLINICAL DATA:  Shortness of breath.  EXAM: CHEST  2 VIEW  COMPARISON:  December 20, 2008.  FINDINGS:  The heart size and mediastinal contours are within normal limits. Both lungs are clear. No pneumothorax or pleural effusion is noted. Mild multilevel degenerative disc disease is noted in the thoracic spine.  IMPRESSION: No active cardiopulmonary disease.   Electronically Signed   By: Lupita Raider, M.D.   On: 08/21/2015 11:33   Ct Angio Chest Aorta W/cm &/or Wo/cm Ct Cta Abd/pel W/cm &/or W/o Cm  08/21/2015   CLINICAL DATA:  Persistent subacute chest pain since Wednesday night starting in the center of the back and radiating to left arm. History of hypertension. Prior  abdominal aortic aneurysm repair.  IMPRESSION: Negative for acute aortic dissection.  Thoracic aortic mild aneurysmal dilatation, atherosclerosis, tortuosity as above.  Status post infrarenal aorto bi femoral bypass without acute vascular process, dissection, occlusion or hemorrhage.  Hepatic and bilateral renal cysts as described  Colonic diverticulosis.   Echocardiogram - none  ECG: Sinus rhythm, minimal non-specific ST-T wave abnormalities.     ASSESSMENT AND PLAN  1. Atypical chest pain - negative troponin x 2, normal ECG, the pain is most probably radiculopathy. Chest CT ruled out pulmonary embolism.  He is currently asymptomatic, I would check another troponin and if normal send him home. We will arrange for an outpatient follow up and stress test. Start aspirin 81 mg po daily.   2. Hypertension - elevated diastolic BP, I would restart home dose of lisinopril and amlodipine  3. Hyperlipidemia - no lipids on file, we will check, for now continue simvastatin 20 mg po daily   Signed, Lars Masson, MD, [facility] 08/21/2015, 5:00 PM

## 2015-08-21 NOTE — ED Notes (Signed)
Pt states chest pain since Wed night.  Pain started in center of back and radiates to L arm.

## 2015-08-22 DIAGNOSIS — I10 Essential (primary) hypertension: Secondary | ICD-10-CM | POA: Diagnosis not present

## 2015-08-22 DIAGNOSIS — R072 Precordial pain: Secondary | ICD-10-CM

## 2015-08-22 DIAGNOSIS — E785 Hyperlipidemia, unspecified: Secondary | ICD-10-CM | POA: Diagnosis not present

## 2015-08-22 LAB — TROPONIN I
Troponin I: <0.03 ng/mL (ref <0.031)
Troponin I: <0.03 ng/mL (ref <0.031)

## 2015-08-22 MED ORDER — LISINOPRIL 10 MG PO TABS: 10.0000 mg | ORAL_TABLET | Freq: Every day | ORAL | Status: AC

## 2015-08-22 MED ORDER — AMLODIPINE BESYLATE 5 MG PO TABS: 5.0000 mg | ORAL_TABLET | Freq: Every day | ORAL | Status: AC

## 2015-08-22 NOTE — Discharge Summary (Signed)
Physician Discharge Summary  Earl Riley ZOX:[id number] DOB: 06-06-1928 DOA: 08/21/2015  PCP: Dorrene German, MD  Admit date: 08/21/2015 Discharge date: 08/22/2015  Time spent: 35 minutes  Recommendations for Outpatient Follow-up:  1. Outpatient stress test 2. FLP   Discharge Diagnoses:  Active Problems:   Chest pain   Discharge Condition: improved  Diet recommendation: cardiac  Filed Weights   08/21/15 1057 08/21/15 1746 08/22/15 0543  Weight: 85.049 kg (187 lb 8 oz) 81.285 kg (179 lb 3.2 oz) 82.6 kg (182 lb 1.6 oz)    History of present illness:  Earl Riley is a 81 y.o. male with PMH of HTN, HPL, PAD, CKD, Chronic Cannabis Use, Hypothyroidism is presented with chest pains. Patient is complaining of left sided 8/10 chest pains radiating to his left arm and back. He also reports mild shortness of breath assisociated with chest pains. He denies exertional symptoms. No diaphoresis. No cough, non fever,chills, no nausea, vomiting no abdominal pains.   Hospital Course:  Atypical chest pain - negative troponin x 2, normal ECG, the pain is most probably radiculopathy. Chest CT ruled out pulmonary embolism.  Ce negative  outpatient follow up and stress test by cards aspirin 81 mg po daily.   Hypertension - reduce dose of ACE/norvasc  Hyperlipidemia - FLP outpatient  Procedures:   Consultations:  cards  Discharge Exam: Filed Vitals:   08/22/15 0852  BP: 130/60  Pulse:   Temp:   Resp:     General: A+Ox3, NAD  Discharge Instructions   Discharge Instructions    Diet - low sodium heart healthy    Complete by:  As directed      Discharge instructions    Complete by:  As directed   Outpatient stress test     Increase activity slowly    Complete by:  As directed           Current Discharge Medication List    CONTINUE these medications which have CHANGED   Details  amLODipine (NORVASC) 5 MG tablet Take 1 tablet (5 mg total) by mouth daily. Qty: 30  tablet, Refills: 0    lisinopril (PRINIVIL,ZESTRIL) 10 MG tablet Take 1 tablet (10 mg total) by mouth daily. Qty: 30 tablet, Refills: 0      CONTINUE these medications which have NOT CHANGED   Details  aspirin EC 81 MG tablet Take 81 mg by mouth daily.    levothyroxine (SYNTHROID, LEVOTHROID) 100 MCG tablet Take 100 mcg by mouth daily.    simvastatin (ZOCOR) 40 MG tablet Take 40 mg by mouth daily.      STOP taking these medications     ibuprofen (ADVIL,MOTRIN) 600 MG tablet        No Known Allergies    The results of significant diagnostics from this hospitalization (including imaging, microbiology, ancillary and laboratory) are listed below for reference.    Significant Diagnostic Studies: Dg Chest 2 View  08/21/2015   CLINICAL DATA:  Shortness of breath.  EXAM: CHEST  2 VIEW  COMPARISON:  December 20, 2008.  FINDINGS: The heart size and mediastinal contours are within normal limits. Both lungs are clear. No pneumothorax or pleural effusion is noted. Mild multilevel degenerative disc disease is noted in the thoracic spine.  IMPRESSION: No active cardiopulmonary disease.   Electronically Signed   By: Lupita Raider, M.D.   On: 08/21/2015 11:33   Ct Angio Chest Aorta W/cm &/or Wo/cm  08/21/2015   CLINICAL DATA:  Persistent subacute chest  pain since Wednesday night starting in the center of the back and radiating to left arm. History of hypertension. Prior abdominal aortic aneurysm repair.  EXAM: CT ANGIOGRAPHY CHEST, ABDOMEN AND PELVIS  TECHNIQUE: Multidetector CT imaging through the chest, abdomen and pelvis was performed using the standard protocol during bolus administration of intravenous contrast. Multiplanar reconstructed images and MIPs were obtained and reviewed to evaluate the vascular anatomy.  CONTRAST:  OMNIPAQUE IOHEXOL 350 MG/ML SOLN  COMPARISON:  08/21/2015 chest x-ray  FINDINGS: CTA CHEST FINDINGS  Initial noncontrast imaging of the chest demonstrates aneurysm  dilatation and atherosclerosis of the proximal descending thoracic aorta. No hyperdense intramural hematoma appreciated. No mediastinal hemorrhage or hematoma. Post contrast, the major branch vessels are tortuous but remain patent. Intact thoracic aorta without dissection. Mild aneurysmal dilatation of the proximal descending thoracic aorta, maximal diameter 39 mm.  No adenopathy. Normal heart size. No pericardial or pleural effusion.  Lung windows demonstrate mild upper lobe paraseptal emphysema peripherally. Scattered areas of subpleural parenchymal scarring and atelectasis in the lower lobes. No acute airspace process, definite pneumonia, significant collapse or consolidation. No pneumothorax. Trachea and central airways are patent.  Review of the MIP images confirms the above findings.  CTA ABDOMEN AND PELVIS FINDINGS  Intact abdominal aorta. Status post distal aorta bi-femoral repair. The celiac, SMA, main renal arteries, and IMA all remain patent. Iliac bypass limbs remain patent. The common femoral, proximal profunda femoral, and proximal superficial femoral arteries demonstrated are also patent. No acute vascular process, hemorrhage, dissection, or occlusive process.  Reconstitution proximal left internal iliac artery demonstrates mild aneurysmal dilatation measuring 18 mm, image 24.  Nonvascular: Right hepatic dome 10 mm hypodensity noted, image 99 suspect small hepatic cyst. Other scattered hypodensities throughout the liver noted, suspect hepatic cysts. No biliary dilatation or obstruction. Gallbladder, biliary system, pancreas, spleen, adrenal glands within normal limits for age and demonstrate no acute process.  Scattered numerous hypodense renal cysts noted, largest in the right upper pole measures 4 cm, image 145. 10 mm hyperdense left renal cyst noted in the midpole, image 151.  Negative for bowel obstruction, dilatation, ileus, or free air. Scattered colonic diverticulosis evident. Normal appendix  in the right lower quadrant. No abdominal or pelvic free fluid, fluid collection, hemorrhage, abscess, or adenopathy.  Bones are osteopenic. Degenerative changes of the spine diffusely. No acute osseous finding.  Review of the MIP images confirms the above findings.  IMPRESSION: Negative for acute aortic dissection.  Thoracic aortic mild aneurysmal dilatation, atherosclerosis, tortuosity as above.  Status post infrarenal aorto bi femoral bypass without acute vascular process, dissection, occlusion or hemorrhage.  Hepatic and bilateral renal cysts as described  Colonic diverticulosis   Electronically Signed   By: Judie Petit.  Shick M.D.   On: 08/21/2015 15:56   Ct Cta Abd/pel W/cm &/or W/o Cm  08/21/2015   CLINICAL DATA:  Persistent subacute chest pain since Wednesday night starting in the center of the back and radiating to left arm. History of hypertension. Prior abdominal aortic aneurysm repair.  EXAM: CT ANGIOGRAPHY CHEST, ABDOMEN AND PELVIS  TECHNIQUE: Multidetector CT imaging through the chest, abdomen and pelvis was performed using the standard protocol during bolus administration of intravenous contrast. Multiplanar reconstructed images and MIPs were obtained and reviewed to evaluate the vascular anatomy.  CONTRAST:  OMNIPAQUE IOHEXOL 350 MG/ML SOLN  COMPARISON:  08/21/2015 chest x-ray  FINDINGS: CTA CHEST FINDINGS  Initial noncontrast imaging of the chest demonstrates aneurysm dilatation and atherosclerosis of the proximal descending thoracic  aorta. No hyperdense intramural hematoma appreciated. No mediastinal hemorrhage or hematoma. Post contrast, the major branch vessels are tortuous but remain patent. Intact thoracic aorta without dissection. Mild aneurysmal dilatation of the proximal descending thoracic aorta, maximal diameter 39 mm.  No adenopathy. Normal heart size. No pericardial or pleural effusion.  Lung windows demonstrate mild upper lobe paraseptal emphysema peripherally. Scattered areas of  subpleural parenchymal scarring and atelectasis in the lower lobes. No acute airspace process, definite pneumonia, significant collapse or consolidation. No pneumothorax. Trachea and central airways are patent.  Review of the MIP images confirms the above findings.  CTA ABDOMEN AND PELVIS FINDINGS  Intact abdominal aorta. Status post distal aorta bi-femoral repair. The celiac, SMA, main renal arteries, and IMA all remain patent. Iliac bypass limbs remain patent. The common femoral, proximal profunda femoral, and proximal superficial femoral arteries demonstrated are also patent. No acute vascular process, hemorrhage, dissection, or occlusive process.  Reconstitution proximal left internal iliac artery demonstrates mild aneurysmal dilatation measuring 18 mm, image 24.  Nonvascular: Right hepatic dome 10 mm hypodensity noted, image 99 suspect small hepatic cyst. Other scattered hypodensities throughout the liver noted, suspect hepatic cysts. No biliary dilatation or obstruction. Gallbladder, biliary system, pancreas, spleen, adrenal glands within normal limits for age and demonstrate no acute process.  Scattered numerous hypodense renal cysts noted, largest in the right upper pole measures 4 cm, image 145. 10 mm hyperdense left renal cyst noted in the midpole, image 151.  Negative for bowel obstruction, dilatation, ileus, or free air. Scattered colonic diverticulosis evident. Normal appendix in the right lower quadrant. No abdominal or pelvic free fluid, fluid collection, hemorrhage, abscess, or adenopathy.  Bones are osteopenic. Degenerative changes of the spine diffusely. No acute osseous finding.  Review of the MIP images confirms the above findings.  IMPRESSION: Negative for acute aortic dissection.  Thoracic aortic mild aneurysmal dilatation, atherosclerosis, tortuosity as above.  Status post infrarenal aorto bi femoral bypass without acute vascular process, dissection, occlusion or hemorrhage.  Hepatic and  bilateral renal cysts as described  Colonic diverticulosis   Electronically Signed   By: Judie Petit.  Shick M.D.   On: 08/21/2015 15:56    Microbiology: No results found for this or any previous visit (from the past 240 hour(s)).   Labs: Basic Metabolic Panel:  Recent Labs Lab 08/21/15 1203  NA 138  K 4.2  CL 106  CO2 25  GLUCOSE 95  BUN 15  CREATININE 1.49*  CALCIUM 9.7   Liver Function Tests: No results for input(s): AST, ALT, ALKPHOS, BILITOT, PROT, ALBUMIN in the last 168 hours. No results for input(s): LIPASE, AMYLASE in the last 168 hours. No results for input(s): AMMONIA in the last 168 hours. CBC:  Recent Labs Lab 08/21/15 1203  WBC 6.3  HGB 13.1  HCT 40.0  MCV 94.6  PLT 154   Cardiac Enzymes:  Recent Labs Lab 08/21/15 1839 08/21/15 2350 08/22/15 0626  TROPONINI <0.03 <0.03 <0.03   BNP: BNP (last 3 results) No results for input(s): BNP in the last 8760 hours.  ProBNP (last 3 results) No results for input(s): PROBNP in the last 8760 hours.  CBG: No results for input(s): GLUCAP in the last 168 hours.     SignedMarlin Canary  [facility] 08/22/2015, 11:15 AM

## 2015-08-24 ENCOUNTER — Other Ambulatory Visit: Payer: Self-pay | Admitting: Physician Assistant

## 2015-08-24 DIAGNOSIS — R079 Chest pain, unspecified: Secondary | ICD-10-CM

## 2015-08-25 ENCOUNTER — Telehealth (HOSPITAL_COMMUNITY): Payer: Self-pay | Admitting: *Deleted

## 2015-08-25 ENCOUNTER — Telehealth: Payer: Self-pay

## 2015-08-25 NOTE — Telephone Encounter (Signed)
NO DOCUMENTION

## 2015-08-25 NOTE — Telephone Encounter (Signed)
Left message on voicemail in reference to upcoming appointment scheduled for 08/27/15. Phone number given for a call back so details instructions can be given. Hasspacher, Cynthia W   

## 2015-08-26 ENCOUNTER — Telehealth (HOSPITAL_COMMUNITY): Payer: Self-pay | Admitting: *Deleted

## 2015-08-26 NOTE — Telephone Encounter (Signed)
Left message on voicemail in reference to upcoming appointment scheduled for 08/27/15. Phone number given for a call back so details instructions can be given. Hanna Ra J Duanna Runk, RN 

## 2015-08-27 ENCOUNTER — Ambulatory Visit (HOSPITAL_COMMUNITY): Payer: Medicare HMO | Attending: Cardiology

## 2015-08-27 DIAGNOSIS — R079 Chest pain, unspecified: Secondary | ICD-10-CM | POA: Insufficient documentation

## 2015-08-27 DIAGNOSIS — R0609 Other forms of dyspnea: Secondary | ICD-10-CM | POA: Insufficient documentation

## 2015-08-27 DIAGNOSIS — I10 Essential (primary) hypertension: Secondary | ICD-10-CM | POA: Insufficient documentation

## 2015-08-27 LAB — MYOCARDIAL PERFUSION IMAGING
CHL CUP NUCLEAR SDS: 1
CHL CUP NUCLEAR SRS: 3
CHL CUP RESTING HR STRESS: 51 {beats}/min
LV dias vol: 98 mL
LV sys vol: 45 mL
NUC STRESS TID: 1.02
Peak HR: 94 {beats}/min
RATE: 0.32
SSS: 4

## 2015-08-27 MED ORDER — TECHNETIUM TC 99M SESTAMIBI GENERIC - CARDIOLITE
30.7000 | Freq: Once | INTRAVENOUS | Status: AC | PRN
  Administered 2015-08-27: 30.7 via INTRAVENOUS

## 2015-08-27 MED ORDER — TECHNETIUM TC 99M SESTAMIBI GENERIC - CARDIOLITE
10.2000 | Freq: Once | INTRAVENOUS | Status: AC | PRN
  Administered 2015-08-27: 10 via INTRAVENOUS

## 2015-08-27 MED ORDER — REGADENOSON 0.4 MG/5ML IV SOLN
0.4000 mg | Freq: Once | INTRAVENOUS | Status: AC
  Administered 2015-08-27: 0.4 mg via INTRAVENOUS

## 2015-09-04 ENCOUNTER — Telehealth: Payer: Self-pay | Admitting: Physician Assistant

## 2015-09-04 NOTE — Telephone Encounter (Signed)
Patient informed and verbalized understanding of plan. 

## 2015-09-04 NOTE — Telephone Encounter (Signed)
New message      Returning a nurses call to get stress test results.  Please call after 1pm

## 2015-09-04 NOTE — Telephone Encounter (Signed)
Notes Recorded by Laurann Montana, PA-C on 08/28/2015 at 11:52 AM Please let patient know stress test was normal. F/u PCP. Only needs to f/u cardiology as needed. Dayna Dunn PA-C

## 2017-10-09 ENCOUNTER — Emergency Department (HOSPITAL_COMMUNITY): Payer: Medicare HMO

## 2017-10-09 ENCOUNTER — Encounter (HOSPITAL_COMMUNITY): Payer: Self-pay | Admitting: Family Medicine

## 2017-10-09 ENCOUNTER — Emergency Department (HOSPITAL_COMMUNITY)
Admission: EM | Admit: 2017-10-09 | Discharge: 2017-10-09 | Disposition: A | Discharge status: Home / Self Care | Payer: Medicare HMO | Attending: Emergency Medicine | Admitting: Emergency Medicine

## 2017-10-09 DIAGNOSIS — Z87891 Personal history of nicotine dependence: Secondary | ICD-10-CM | POA: Diagnosis not present

## 2017-10-09 DIAGNOSIS — W19XXXA Unspecified fall, initial encounter: Secondary | ICD-10-CM

## 2017-10-09 DIAGNOSIS — Z7982 Long term (current) use of aspirin: Secondary | ICD-10-CM | POA: Diagnosis not present

## 2017-10-09 DIAGNOSIS — I10 Essential (primary) hypertension: Secondary | ICD-10-CM | POA: Diagnosis not present

## 2017-10-09 DIAGNOSIS — Z79899 Other long term (current) drug therapy: Secondary | ICD-10-CM | POA: Diagnosis not present

## 2017-10-09 DIAGNOSIS — M25552 Pain in left hip: Secondary | ICD-10-CM | POA: Diagnosis not present

## 2017-10-09 MED ORDER — OXYCODONE-ACETAMINOPHEN 5-325 MG PO TABS: 1.0000 | ORAL_TABLET | Freq: Three times a day (TID) | ORAL | 0 refills | Status: DC | PRN

## 2017-10-09 NOTE — ED Triage Notes (Signed)
Patient reports he tripped while in the bathroom on a throw rug. Patient reports he landed on his left hip and hit his head on the dresser. Denies LOC. Patient is complaining of left hip pain and started using an old pair of crutches.

## 2017-10-09 NOTE — Care Management Note (Signed)
Case Management Note  Patient Details  Name: Earl Riley MRN: [id number] Date of Birth: 1928/02/19  Subjective/Objective:                  83 y.o. male is who presents to the emergency department with a chief complaint of fall.  Action/Plan: CM consulted by Lilian KapurMcDonald, PA for walker.  Reports that pt has had recent falls but can walk using crutches and drove himself here. No HHS needed, just a walker.  Advised to place order for DME and CM would contact [facility] to deliver walker prior to transition home from the ED. Spoke with Clydie BraunKaren who will deliver walker to the pt's room. No further CM needs noted at this time.  Expected Discharge Date:   10/09/2017               Expected Discharge Plan:  Home/Self Care  Discharge planning Services  CM Consult  Post Acute Care Choice:  Durable Medical Equipment Choice offered to:     DME Arranged:  Dan HumphreysWalker DME Agency:  [facility]  Status of Service:  Completed, signed off  Rica KoyanagiKritzer, Umar Patmon N, RN 10/09/2017, 3:44 PM

## 2017-10-09 NOTE — ED Notes (Signed)
EDPA Provider at bedside. 

## 2017-10-09 NOTE — ED Provider Notes (Signed)
[facility]-EMERGENCY DEPT Provider Note   CSN: [id number] Arrival date & time: 10/09/17  1113     History   Chief Complaint Chief Complaint  Patient presents with  . Fall    HPI Earl Riley is a 83 y.o. male is who presents to the emergency department with a chief complaint of fall.  He reports he slipped on a throw rug in his bathroom around 2:30 AM and fell backwards.  He reports that he was able to grab onto a shoe rack on the back of the door to slow his fall, but he hit the back of his head and landed on his buttocks and left hip.  He reports that he laid on the floor for a few minutes, but was able to get up and ambulate to the other room to lay on the couch.  He reports that he did not want to try to the emergency department in the middle the night, so he waited until this morning to drive himself to the emergency department.  He reports that he found an old pair of crutches at home and has been ambulatory with crutches since.  He states that he is able to bear weight on both legs, but it is painful to take steps with his left hip.  He denies LOC, nausea, emesis, dizziness, visual changes, lightheadedness, headache, N/V, chest pain, abdominal pain, or neck pain.  No previous injuries to the left hip or previous surgeries.  He does not take any blood thinners. He lives alone.  He has a niece who lives locally, and can come to stay with him when he needs assistance.  The history is provided by the patient. No language interpreter was used.  Fall  This is a new problem. The current episode started 12 to 24 hours ago. The problem occurs constantly. The problem has not changed since onset.Pertinent negatives include no chest pain, no abdominal pain, no headaches and no shortness of breath. The symptoms are aggravated by walking. The symptoms are relieved by lying down and rest. Treatments tried: crutches. The treatment provided moderate relief.    Past Medical  History:  Diagnosis Date  . Gout   . Hypercholesteremia   . Hypertension   . Thyroid disease     Patient Active Problem List   Diagnosis Date Noted  . Chest pain 08/21/2015    Past Surgical History:  Procedure Laterality Date  . ABDOMINAL SURGERY     aneurism repair       Home Medications    Prior to Admission medications   Medication Sig Start Date End Date Taking? Authorizing Provider  amLODipine (NORVASC) 5 MG tablet Take 1 tablet (5 mg total) by mouth daily. 08/22/15  Yes Joseph ArtVann, Jessica U, DO  aspirin EC 81 MG tablet Take 81 mg by mouth daily.   Yes [provider]  ibuprofen (ADVIL,MOTRIN) 200 MG tablet Take 200 mg 2 (two) times daily as needed by mouth for moderate pain.   Yes [provider]  levothyroxine (SYNTHROID, LEVOTHROID) 100 MCG tablet Take 100 mcg by mouth daily. 08/11/15  Yes [provider]  lisinopril (PRINIVIL,ZESTRIL) 10 MG tablet Take 1 tablet (10 mg total) by mouth daily. 08/22/15  Yes Vann, Jessica U, DO  simvastatin (ZOCOR) 40 MG tablet Take 40 mg by mouth daily. 07/08/15  Yes [provider]    Family History History reviewed. No pertinent family history.  Social History Social History   Tobacco Use  .  Smoking status: Former Games developer  . Smokeless tobacco: Never Used  Substance Use Topics  . Alcohol use: No  . Drug use: Yes    Types: Marijuana    Comment: Last used: Last Sunday      Allergies   Patient has no known allergies.   Review of Systems Review of Systems  Respiratory: Negative for shortness of breath.   Cardiovascular: Negative for chest pain.  Gastrointestinal: Negative for abdominal pain.  Musculoskeletal: Positive for arthralgias, back pain, gait problem and myalgias. Negative for neck pain.  Neurological: Negative for dizziness, syncope, speech difficulty, weakness, light-headedness, numbness and headaches.     Physical Exam Updated Vital Signs BP 126/74 (BP Location: Left Arm)    Pulse 89   Temp 97.6 F (36.4 C) (Oral)   Resp 18   Ht 5\' 10"  (1.778 m)   Wt 83.9 kg (185 lb)   SpO2 98%   BMI 26.54 kg/m   Physical Exam  Constitutional: He is oriented to person, place, and time. He appears well-developed.  Appears much younger than stated age.  HENT:  Head: Normocephalic and atraumatic. Head is without raccoon's eyes and without Battle's sign.  The scalp is bare. No crepitus or step-offs.  No hematomas.  No overlying ecchymosis, erythema, edema or warmth.  Atraumatic appearing.  Eyes: Conjunctivae are normal.  Neck: Normal range of motion. Neck supple.  Cardiovascular: Normal rate, regular rhythm, normal heart sounds and intact distal pulses. Exam reveals no gallop and no friction rub.  No murmur heard. Pulmonary/Chest: Effort normal. No stridor. He has no wheezes. He has no rales.  Abdominal: Soft. He exhibits no distension. There is no tenderness. There is no guarding.  Musculoskeletal: He exhibits tenderness. He exhibits no edema or deformity.  Reproducible TTP over the left  Proximal thigh and left posterior pelvis. No shortening of the left left. FROM of the left hip, knee, and ankle. Able to bear weight on the bilateral lower extremities. Antalgic gait. No ataxia or coordination difficulties. DP and PT pulses are 2+ bilaterally. 5/5 motor strength of the bilateral lower extremities against resistance and sensation is intact throughout.  No tenderness to palpation of the cervical, thoracic, or lumbar spinous processes or surrounding paraspinal muscles.  Neurological: He is alert and oriented to person, place, and time.  Skin: Skin is warm and dry.  Psychiatric: His behavior is normal.  Nursing note and vitals reviewed.    ED Treatments / Results  Labs (all labs ordered are listed, but only abnormal results are displayed) Labs Reviewed - No data to display  EKG  EKG Interpretation None       Radiology Dg Hip Unilat With Pelvis 2-3 Views  Left  Result Date: 10/09/2017 CLINICAL DATA:  Left more than right hip pain since fall this morning. Initial encounter. EXAM: DG HIP (WITH OR WITHOUT PELVIS) 2-3V LEFT COMPARISON:  None. FINDINGS: No evidence of hip fracture or dislocation. Negative for pelvic ring fracture or diastasis. Osteopenia. Lumbar spondylosis. IMPRESSION: No acute finding. Electronically Signed   By: Marnee Spring M.D.   On: 10/09/2017 12:07    Procedures Procedures (including critical care time)  Medications Ordered in ED Medications - No data to display   Initial Impression / Assessment and Plan / ED Course  I have reviewed the triage vital signs and the nursing notes.  Pertinent labs & imaging results that were available during my care of the patient were reviewed by me and considered in my medical decision making (see  chart for details).     83 year old male presenting after a fall approximately 12 hours ago from standing where he hit his head and landed on his left hip.  No blood thinners. No syncope or headache.  On physical exam reproducible tenderness to palpation over the left proximal thigh and left posterior pelvis. Posterior scalp appears atraumatic.  Normal neurologic exam.  X-ray of the left hip and pelvis is unremarkable for fracture or dislocation.  The patient was discussed and evaluated with Dr. Particia Nearing, attending physician.  The patient has been ambulating with crutches at home since the fall, but after discussion he states that he would feel more comfortable with a rolling walker until his pain improves. Spoke with Marylene Land with case management who will work with home health to obtain a rolling walker prior to discharge.  At this time, I do not feel that the patient needs additional home health services.  Patient ambulated in the department by me with a walker he appears steady on his feet.  He also states that he has a niece who lives locally who can stay with him at his home if he needs any  additional assistance. 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  He has a history of gout.  His Cr on his most recent visit was 1.49.  Discussed with the patient to avoid NSAIDs.  He states his flare began 3-4 days ago.  Given this timeframe, will not discharge the patient with colchicine.  So the patient with a short course of Percocet for pain control.  Encouraged Tylenol for symptomatic pain for his hip as well as RICE therapy.  Strict return precautions given.  No acute distress.  Patient is safe for discharge at this time.  Final Clinical Impressions(s) / ED Diagnoses   Final diagnoses:  Fall, initial encounter  Left hip pain    ED Discharge Orders    None       Barkley Boards, PA-C 10/09/17 1703    Jacalyn Lefevre, MD 10/09/17 1907

## 2017-10-09 NOTE — ED Notes (Signed)
Bed: WA18 Expected date:  Expected time:  Means of arrival:  Comments: 

## 2017-10-09 NOTE — Discharge Instructions (Addendum)
Please follow up with Dr. Concepcion ElkAvbuere if your pain does not start to improve in the next week.  Take one tablet of Percocet once every 8 hours for severe pain.  Please do not drive while taking this medication because it can cause her to be impaired. Please do not take this medication more than prescribed. Please note, it is a narcotic and can be addicting.  You can take 650 mg of Tylenol every 8 hours for pain control.  Please avoid Aleve and ibuprofen because it can be harmful to your kidneys.  Please use your walker until your pain improves and you are able to put weight on her left foot without difficulty.  Please apply ice for 15-20 minutes up to 3-4 times a day to help with pain and inflammation. Elevate your left leg on pillows above the level of your heart when you are sitting and resting.   If you develop new symptoms, including numbness or weakness in the left leg, nausea or vomiting that does not improve, if you have a new fall, or other concerning symptoms, please return to the Emergency Department for re-evaluation.

## 2017-12-12 LAB — LIPID PANEL: Cholesterol: 160 (ref 0–200)

## 2018-04-02 ENCOUNTER — Emergency Department (HOSPITAL_COMMUNITY)
Admission: EM | Admit: 2018-04-02 | Discharge: 2018-04-02 | Disposition: A | Discharge status: Home / Self Care | Payer: Medicare HMO | Attending: Emergency Medicine | Admitting: Emergency Medicine

## 2018-04-02 ENCOUNTER — Emergency Department (HOSPITAL_COMMUNITY): Payer: Medicare HMO

## 2018-04-02 ENCOUNTER — Encounter (HOSPITAL_COMMUNITY): Payer: Self-pay

## 2018-04-02 ENCOUNTER — Other Ambulatory Visit: Payer: Self-pay

## 2018-04-02 DIAGNOSIS — Z87891 Personal history of nicotine dependence: Secondary | ICD-10-CM | POA: Diagnosis not present

## 2018-04-02 DIAGNOSIS — M109 Gout, unspecified: Secondary | ICD-10-CM

## 2018-04-02 DIAGNOSIS — M79642 Pain in left hand: Secondary | ICD-10-CM | POA: Diagnosis present

## 2018-04-02 DIAGNOSIS — I10 Essential (primary) hypertension: Secondary | ICD-10-CM | POA: Diagnosis not present

## 2018-04-02 DIAGNOSIS — Z7982 Long term (current) use of aspirin: Secondary | ICD-10-CM | POA: Insufficient documentation

## 2018-04-02 DIAGNOSIS — Z79899 Other long term (current) drug therapy: Secondary | ICD-10-CM | POA: Insufficient documentation

## 2018-04-02 MED ORDER — PREDNISONE 20 MG PO TABS: 40.0000 mg | ORAL_TABLET | Freq: Every day | ORAL | 0 refills | Status: DC

## 2018-04-02 MED ORDER — DICLOFENAC SODIUM 1 % TD GEL: 2.0000 g | Freq: Four times a day (QID) | TRANSDERMAL | 0 refills | Status: AC
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
## 2018-04-02 NOTE — Discharge Instructions (Addendum)
Take prednisone as prescribed for the next 5 days.  Apply Voltaren gel to the affected joints 4 times daily as needed.  Please follow-up with your doctor for recheck of your gout.  You may want to begin taking the medication prescribed for prevention of your gout flareups.  Please return to the emergency department if you develop any new or worsening symptoms including fever.

## 2018-04-02 NOTE — ED Triage Notes (Signed)
Patient c/o left hand swelling and very little pain. Paitent states he uses that hand to push himself up off of the sofa. Selling and pain x 3 days. Patient states he has been using Capzasin and ice to the left hand.

## 2018-05-08 ENCOUNTER — Encounter: Payer: Self-pay | Admitting: Physician Assistant

## 2018-05-08 ENCOUNTER — Ambulatory Visit (INDEPENDENT_AMBULATORY_CARE_PROVIDER_SITE_OTHER): Payer: Medicare HMO

## 2018-05-08 ENCOUNTER — Ambulatory Visit (INDEPENDENT_AMBULATORY_CARE_PROVIDER_SITE_OTHER): Payer: Medicare HMO | Admitting: Physician Assistant

## 2018-05-08 VITALS — BP 114/76 | HR 90 | Temp 98.2°F | Resp 17 | Ht 67.5 in | Wt 175.0 lb

## 2018-05-08 DIAGNOSIS — L299 Pruritus, unspecified: Secondary | ICD-10-CM

## 2018-05-08 DIAGNOSIS — R2232 Localized swelling, mass and lump, left upper limb: Secondary | ICD-10-CM | POA: Diagnosis not present

## 2018-05-08 DIAGNOSIS — M109 Gout, unspecified: Secondary | ICD-10-CM | POA: Diagnosis not present

## 2018-05-08 LAB — GLUCOSE, POCT (MANUAL RESULT ENTRY): POC Glucose: 97 mg/dl (ref 70–99)

## 2018-05-08 MED ORDER — HYDROXYZINE HCL 25 MG PO TABS: 25.0000 mg | ORAL_TABLET | Freq: Three times a day (TID) | ORAL | 0 refills | Status: AC | PRN

## 2018-05-08 MED ORDER — PREDNISONE 10 MG PO TABS: ORAL_TABLET | ORAL | 0 refills | Status: DC

## 2018-05-08 NOTE — Patient Instructions (Addendum)
Your blood sugar looks great.   Start taking Prednisone every day for the next few weeks: Take 4 pills x 5 days, then take 3 pills x 5 days, then take 2 pills x 5 days, then take 1 pill x 5 days. (You may lower the dose of the medication every 3-4 days (rather than every 5 days) if the  itching is too much). Start taking Hydroxyzine for itching as needed.   If you cannot tolerate Prednisone on an empty stomach, you can take it with food.   Come back and see me in 3 days to be sure your swelling is improving.   Once you are better, you will come back and see me for some blood work. I want to check your uric acid level to see how high it is. We will consider starting you back on Allopurinol at that time.    Gout Gout is painful swelling that can occur in some of your joints. Gout is a type of arthritis. This condition is caused by having too much uric acid in your body. Uric acid is a chemical that forms when your body breaks down substances called purines. Purines are important for building body proteins. When your body has too much uric acid, sharp crystals can form and build up inside your joints. This causes pain and swelling. Gout attacks can happen quickly and be very painful (acute gout). Over time, the attacks can affect more joints and become more frequent (chronic gout). Gout can also cause uric acid to build up under your skin and inside your kidneys. What are the causes? This condition is caused by too much uric acid in your blood. This can occur because:  Your kidneys do not remove enough uric acid from your blood. This is the most common cause.  Your body makes too much uric acid. This can occur with some cancers and cancer treatments. It can also occur if your body is breaking down too many red blood cells (hemolytic anemia).  You eat too many foods that are high in purines. These foods include organ meats and some seafood. Alcohol, especially beer, is also high in purines.  A  gout attack may be triggered by trauma or stress. What increases the risk? This condition is more likely to develop in people who:  Have a family history of gout.  Are male and middle-aged.  Are male and have gone through menopause.  Are obese.  Frequently drink alcohol, especially beer.  Are dehydrated.  Lose weight too quickly.  Have an organ transplant.  Have lead poisoning.  Take certain medicines, including aspirin, cyclosporine, diuretics, levodopa, and niacin.  Have kidney disease or psoriasis.  What are the signs or symptoms? An attack of acute gout happens quickly. It usually occurs in just one joint. The most common place is the big toe. Attacks often start at night. Other joints that may be affected include joints of the feet, ankle, knee, fingers, wrist, or elbow. Symptoms may include:  Severe pain.  Warmth.  Swelling.  Stiffness.  Tenderness. The affected joint may be very painful to touch.  Shiny, red, or purple skin.  Chills and fever.  Chronic gout may cause symptoms more frequently. More joints may be involved. You may also have white or yellow lumps (tophi) on your hands or feet or in other areas near your joints. How is this diagnosed? This condition is diagnosed based on your symptoms, medical history, and physical exam. You may have tests, such as:  Blood tests to measure uric acid levels.  Removal of joint fluid with a needle (aspiration) to look for uric acid crystals.  X-rays to look for joint damage.  How is this treated? Treatment for this condition has two phases: treating an acute attack and preventing future attacks. Acute gout treatment may include medicines to reduce pain and swelling, including:  NSAIDs.  Steroids. These are strong anti-inflammatory medicines that can be taken by mouth (orally) or injected into a joint.  Colchicine. This medicine relieves pain and swelling when it is taken soon after an attack. It can be  given orally or through an IV tube.  Preventive treatment may include:  Daily use of smaller doses of NSAIDs or colchicine.  Use of a medicine that reduces uric acid levels in your blood.  Changes to your diet. You may need to see a specialist about healthy eating (dietitian).  Follow these instructions at home: During a Gout Attack  If directed, apply ice to the affected area: ? Put ice in a plastic bag. ? Place a towel between your skin and the bag. ? Leave the ice on for 20 minutes, 2-3 times a day.  Rest the joint as much as possible. If the affected joint is in your leg, you may be given crutches to use.  Raise (elevate) the affected joint above the level of your heart as often as possible.  Drink enough fluids to keep your urine clear or pale yellow.  Take over-the-counter and prescription medicines only as told by your health care provider.  Do not drive or operate heavy machinery while taking prescription pain medicine.  Follow instructions from your health care provider about eating or drinking restrictions.  Return to your normal activities as told by your health care provider. Ask your health care provider what activities are safe for you. Avoiding Future Gout Attacks  Follow a low-purine diet as told by your dietitian or health care provider. Avoid foods and drinks that are high in purines, including liver, kidney, anchovies, asparagus, herring, mushrooms, mussels, and beer.  Limit alcohol intake to no more than 1 drink a day for nonpregnant women and 2 drinks a day for men. One drink equals 12 oz of beer, 5 oz of wine, or 1 oz of hard liquor.  Maintain a healthy weight or lose weight if you are overweight. If you want to lose weight, talk with your health care provider. It is important that you do not lose weight too quickly.  Start or maintain an exercise program as told by your health care provider.  Drink enough fluids to keep your urine clear or pale  yellow.  Take over-the-counter and prescription medicines only as told by your health care provider.  Keep all follow-up visits as told by your health care provider. This is important. Contact a health care provider if:  You have another gout attack.  You continue to have symptoms of a gout attack after10 days of treatment.  You have side effects from your medicines.  You have chills or a fever.  You have burning pain when you urinate.  You have pain in your lower back or belly. Get help right away if:  You have severe or uncontrolled pain.  You cannot urinate. This information is not intended to replace advice given to you by your health care provider. Make sure you discuss any questions you have with your health care provider. Document Released: 11/18/2000 Document Revised: 04/28/2016 Document Reviewed: 09/03/2015 Elsevier Interactive Patient Education  2018 [employer]vier [employer].  IF you received an x-ray today, you will receive an invoice from [facility]. Please contact [facility] at [phone] with questions or concerns regarding your invoice.   IF you received labwork today, you will receive an invoice from [facility]. Please contact [facility] at [phone] with questions or concerns regarding your invoice.   Our billing staff will not be able to assist you with questions regarding bills from these companies.  You will be contacted with the lab results as soon as they are available. The fastest way to get your results is to activate your My Chart account. Instructions are located on the last page of this paperwork. If you have not heard from [place] regarding the results in 2 weeks, please contact this office.

## 2018-05-08 NOTE — Progress Notes (Signed)
Earl JollyFred D Current  MRN: [id number] DOB: 08-23-1928  PCP: Fleet ContrasAvbuere, Edwin, MD  Subjective:  Pt is an 84 year old male gout, hypertension, thyroid and anemia who presents to clinic for left hand pain x 2 weeks. Hand is swollen. Painful to make a fist.  He has a h/o gout. Most recently treated in the emergency department for gout in left hand with topical NSAIDs and prednisone. He got 100% better after treatment. Prednisone made him itch.   Two weeks ago he ate shrimp and fried fish. Left hand swelling began after this.    Review of Systems  Musculoskeletal: Positive for arthralgias (left hand) and joint swelling.  Skin: Positive for color change.  Neurological: Negative for weakness and numbness.    Patient Active Problem List   Diagnosis Date Noted  . Chest pain 08/21/2015    Current Outpatient Medications on File Prior to Visit  Medication Sig Dispense Refill  . amLODipine (NORVASC) 5 MG tablet Take 1 tablet (5 mg total) by mouth daily. 30 tablet 0  . aspirin EC 81 MG tablet Take 81 mg by mouth daily.    . diclofenac sodium (VOLTAREN) 1 % GEL Apply 2 g topically 4 (four) times daily. 100 g 0  . ibuprofen (ADVIL,MOTRIN) 200 MG tablet Take 200 mg 2 (two) times daily as needed by mouth for moderate pain.    Marland Kitchen. levothyroxine (SYNTHROID, LEVOTHROID) 100 MCG tablet Take 100 mcg by mouth daily.    Marland Kitchen. lisinopril (PRINIVIL,ZESTRIL) 10 MG tablet Take 1 tablet (10 mg total) by mouth daily. 30 tablet 0  . oxyCODONE-acetaminophen (PERCOCET/ROXICET) 5-325 MG tablet Take 1 tablet every 8 (eight) hours as needed by mouth for severe pain. 8 tablet 0  . predniSONE (DELTASONE) 20 MG tablet Take 2 tablets (40 mg total) by mouth daily. 10 tablet 0  . simvastatin (ZOCOR) 40 MG tablet Take 40 mg by mouth daily.     No current facility-administered medications on file prior to visit.     No Known Allergies   Objective:  BP 114/76   Pulse 90   Temp 98.2 F (36.8 C) (Oral)   Resp 17   Ht 5' 7.5"  (1.715 m)   Wt 175 lb (79.4 kg)   SpO2 98%   BMI 27.00 kg/m   Physical Exam  Constitutional: He is oriented to person, place, and time. He appears well-developed and well-nourished.  Cardiovascular: Intact distal pulses and normal pulses.  Musculoskeletal:       Left hand: He exhibits bony tenderness and swelling. Normal sensation noted. Normal strength noted.  Edema from proximal wrist to distal digits. Warmth around all mtp joints. Skin is not taught.   Neurological: He is alert and oriented to person, place, and time.  Skin: Skin is warm and dry.  Psychiatric: He has a normal mood and affect. His behavior is normal. Judgment and thought content normal.  Vitals reviewed.     Results for orders placed or performed in visit on 05/08/18  POCT glucose (manual entry)  Result Value Ref Range   POC Glucose 97 70 - 99 mg/dl   Dg Hand Complete Left  Result Date: 05/08/2018 CLINICAL DATA:  LEFT hand swelling.  History of gout. EXAM: LEFT HAND - COMPLETE 3+ VIEW COMPARISON:  Plain film of the LEFT hand dated 04/02/2017. FINDINGS: No acute appearing osseous abnormality. Multifocal periarticular erosive changes are again seen, stable, again most prominent at the first IP joint, second PIP joint, third PIP joint, fourth DIP  joint and fifth DIP joint. 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 Similar erosive changes are seen throughout the carpal bones, stable. IMPRESSION: 1. No acute findings. 2. Stable appearance of the multifocal periarticular erosions and gouty tophus, as detailed above. Electronically Signed   By: Bary Richard M.D.   On: 05/08/2018 11:55    Assessment and Plan :  1. Acute gout of left hand, unspecified cause 2. Localized swelling on left hand - POCT glucose (manual entry) - predniSONE (DELTASONE) 10 MG tablet; Take 4 pills x 5 days, then take 3 pills x 5 days, then take 2 pills x 5 days, then take 1 pill x 5 days.  Dispense: 50 tablet; Refill: 0 - POCT glucose  (manual entry) - DG Hand Complete Left; Future - Pt presents with left hand pain and swelling. Pt has h/o gout. X-ray shows multifocal periarticular erosions and gouty tophus. Plan to treat with prednisone. POCT glucose is wnl. RTC in 3 days to monitor improvement. Plan to check uric acid level in the future and consider allopurinol.  3. Itching - hydrOXYzine (ATARAX/VISTARIL) 25 MG tablet; Take 1 tablet (25 mg total) by mouth every 8 (eight) hours as needed for itching.  Dispense: 30 tablet; Refill: 0   Marco Collie, PA-C  Primary Care at [facility] 05/08/2018 11:23 AM

## 2018-05-11 ENCOUNTER — Other Ambulatory Visit: Payer: Self-pay

## 2018-05-11 ENCOUNTER — Ambulatory Visit (INDEPENDENT_AMBULATORY_CARE_PROVIDER_SITE_OTHER): Payer: Medicare HMO | Admitting: Physician Assistant

## 2018-05-11 ENCOUNTER — Encounter: Payer: Self-pay | Admitting: Physician Assistant

## 2018-05-11 VITALS — BP 132/72 | HR 51 | Temp 97.7°F | Resp 16 | Ht 66.0 in | Wt 179.6 lb

## 2018-05-11 DIAGNOSIS — Z8739 Personal history of other diseases of the musculoskeletal system and connective tissue: Secondary | ICD-10-CM

## 2018-05-11 NOTE — Patient Instructions (Addendum)
   I am glad you are improving! Come back and see me in 3 weeks for uric acid check.   Thank you for coming in today. I hope you feel we met your needs.  Feel free to call PCP if you have any questions or further requests.  Please consider signing up for MyChart if you do not already have it, as this is a great way to communicate with me.  Best,  Whitney McVey, PA-C   IF you received an x-ray today, you will receive an invoice from [facility]. Please contact [facility] at [phone] with questions or concerns regarding your invoice.   IF you received labwork today, you will receive an invoice from [place]. Please contact [facility] at [phone] with questions or concerns regarding your invoice.   Our billing staff will not be able to assist you with questions regarding bills from these companies.  You will be contacted with the lab results as soon as they are available. The fastest way to get your results is to activate your My Chart account. Instructions are located on the last page of this paperwork. If you have not heard from [place] regarding the results in 2 weeks, please contact this office.

## 2018-05-11 NOTE — Progress Notes (Signed)
   Earl Riley  MRN: [id number] DOB: 11-11-1928  PCP: Fleet ContrasAvbuere, Edwin, MD  Subjective:  Pt is an 84 year old male gout, hypertension, thyroid and anemia who presents to clinic for follow-up left hand pain x 2 weeks.  He was here 2 days ago and treated for acute gout with prednisone.  X-ray at that time showed multifocal periarticular erosions and gouty tophus He endorses much improvement.  Denies medication side effects.  Review of Systems  Gastrointestinal: Negative.   Musculoskeletal: Positive for arthralgias and joint swelling.    Patient Active Problem List   Diagnosis Date Noted  . Chest pain 08/21/2015    Current Outpatient Medications on File Prior to Visit  Medication Sig Dispense Refill  . amLODipine (NORVASC) 5 MG tablet Take 1 tablet (5 mg total) by mouth daily. 30 tablet 0  . aspirin EC 81 MG tablet Take 81 mg by mouth daily.    . diclofenac sodium (VOLTAREN) 1 % GEL Apply 2 g topically 4 (four) times daily. 100 g 0  . hydrOXYzine (ATARAX/VISTARIL) 25 MG tablet Take 1 tablet (25 mg total) by mouth every 8 (eight) hours as needed for itching. 30 tablet 0  . levothyroxine (SYNTHROID, LEVOTHROID) 100 MCG tablet Take 100 mcg by mouth daily.    Marland Kitchen. lisinopril (PRINIVIL,ZESTRIL) 10 MG tablet Take 1 tablet (10 mg total) by mouth daily. 30 tablet 0  . predniSONE (DELTASONE) 10 MG tablet Take 4 pills x 5 days, then take 3 pills x 5 days, then take 2 pills x 5 days, then take 1 pill x 5 days. 50 tablet 0  . simvastatin (ZOCOR) 40 MG tablet Take 40 mg by mouth daily.    Marland Kitchen. oxyCODONE-acetaminophen (PERCOCET/ROXICET) 5-325 MG tablet Take 1 tablet every 8 (eight) hours as needed by mouth for severe pain. (Patient not taking: Reported on 05/11/2018) 8 tablet 0   No current facility-administered medications on file prior to visit.     No Known Allergies   Objective:  BP 132/72 (BP Location: Left Arm, Patient Position: Sitting, Cuff Size: Normal)   Pulse (!) 51   Temp 97.7 F (36.5 C)  (Oral)   Resp 16   Ht 5\' 6"  (1.676 m)   Wt 179 lb 9.6 oz (81.5 kg)   SpO2 98%   BMI 28.99 kg/m   Physical Exam  Constitutional: He appears well-developed and well-nourished.  Skin:  Swelling improved greatly from 2 days ago.  Vitals reviewed.   Assessment and Plan :  1. History of gout -Patient here 2 days ago complaining of gout and treated with prednisone.  His symptoms are improving greatly.  Return to clinic in 3 weeks for uric acid check.  Marco CollieWhitney Benton Tooker, PA-C  Primary Care at [facility] 05/11/2018 11:32 AM

## 2018-06-01 ENCOUNTER — Ambulatory Visit: Payer: Medicare HMO | Admitting: Physician Assistant

## 2018-06-05 ENCOUNTER — Other Ambulatory Visit: Payer: Self-pay

## 2018-06-05 ENCOUNTER — Ambulatory Visit (INDEPENDENT_AMBULATORY_CARE_PROVIDER_SITE_OTHER): Payer: Medicare HMO | Admitting: Physician Assistant

## 2018-06-05 ENCOUNTER — Encounter: Payer: Self-pay | Admitting: Physician Assistant

## 2018-06-05 VITALS — BP 122/82 | HR 83 | Temp 97.9°F | Resp 16 | Ht 67.0 in | Wt 181.4 lb

## 2018-06-05 DIAGNOSIS — Z8739 Personal history of other diseases of the musculoskeletal system and connective tissue: Secondary | ICD-10-CM

## 2018-06-05 NOTE — Progress Notes (Signed)
   Earl Riley  MRN: [id number] DOB: 1928/06/20  PCP: Fleet ContrasAvbuere, Edwin, MD  Subjective:  Pt is an 84 year old male presents to clinic for recheck of his gout. He was here one month ago for gout flare. This has improved.  He has chronic swelling of joints of his fingers.  He has never taken allopurinol before.  He has had this discussion with his PCP, however never started the medication.  Unknown liver enzyme status.   Review of Systems  Musculoskeletal: Negative for arthralgias and joint swelling.  Skin: Negative.     Patient Active Problem List   Diagnosis Date Noted  . Chest pain 08/21/2015    Current Outpatient Medications on File Prior to Visit  Medication Sig Dispense Refill  . amLODipine (NORVASC) 5 MG tablet Take 1 tablet (5 mg total) by mouth daily. 30 tablet 0  . aspirin EC 81 MG tablet Take 81 mg by mouth daily.    . diclofenac sodium (VOLTAREN) 1 % GEL Apply 2 g topically 4 (four) times daily. 100 g 0  . hydrOXYzine (ATARAX/VISTARIL) 25 MG tablet Take 1 tablet (25 mg total) by mouth every 8 (eight) hours as needed for itching. 30 tablet 0  . levothyroxine (SYNTHROID, LEVOTHROID) 100 MCG tablet Take 100 mcg by mouth daily.    Marland Kitchen. lisinopril (PRINIVIL,ZESTRIL) 10 MG tablet Take 1 tablet (10 mg total) by mouth daily. 30 tablet 0  . oxyCODONE-acetaminophen (PERCOCET/ROXICET) 5-325 MG tablet Take 1 tablet every 8 (eight) hours as needed by mouth for severe pain. 8 tablet 0  . predniSONE (DELTASONE) 10 MG tablet Take 4 pills x 5 days, then take 3 pills x 5 days, then take 2 pills x 5 days, then take 1 pill x 5 days. 50 tablet 0  . simvastatin (ZOCOR) 40 MG tablet Take 40 mg by mouth daily.     No current facility-administered medications on file prior to visit.     No Known Allergies   Objective:  BP 122/82 (BP Location: Left Arm, Patient Position: Sitting, Cuff Size: Normal)   Pulse 83   Temp 97.9 F (36.6 C) (Oral)   Resp 16   Ht 5\' 7"  (1.702 m)   Wt 181 lb 6.4 oz  (82.3 kg)   SpO2 95%   BMI 28.41 kg/m   Physical Exam  Constitutional: He is oriented to person, place, and time. He appears well-developed and well-nourished.  Neurological: He is alert and oriented to person, place, and time.  Skin: Skin is warm and dry.  Nodules involving several joints b/l hands.   Psychiatric: He has a normal mood and affect. His behavior is normal. Judgment and thought content normal.  Vitals reviewed.     Assessment and Plan :  1. History of gout - Pt presents for f/u gout. He is asking to start Allopurinol. Plan to check uric acid and liver enzymes. If liver enzymes are elevated, will not prescribe allopurinol. This was discussed with pt.  - Uric acid - Basic Metabolic Panel    Marco CollieWhitney Surina Storts, PA-C  Primary Care at [facility] 06/05/2018 11:35 AM

## 2018-06-05 NOTE — Patient Instructions (Signed)
     IF you received an x-ray today, you will receive an invoice from [facility]. Please contact  Radiology at [phone] with questions or concerns regarding your invoice.   IF you received labwork today, you will receive an invoice from [facility]. Please contact [facility] at [phone] with questions or concerns regarding your invoice.   Our billing staff will not be able to assist you with questions regarding bills from these companies.  You will be contacted with the lab results as soon as they are available. The fastest way to get your results is to activate your My Chart account. Instructions are located on the last page of this paperwork. If you have not heard from us regarding the results in 2 weeks, please contact this office.     

## 2018-06-06 LAB — BASIC METABOLIC PANEL
BUN/Creatinine Ratio: 10 (ref 10–24)
BUN: 13 mg/dL (ref 8–27)
Creatinine, Ser: 1.35 mg/dL — ABNORMAL HIGH (ref 0.76–1.27)
GFR calc Af Amer: 54 mL/min/{1.73_m2} — ABNORMAL LOW (ref >59)
GFR calc non Af Amer: 47 mL/min/{1.73_m2} — ABNORMAL LOW (ref >59)
Glucose: 89 mg/dL (ref 65–99)
Sodium: 144 mmol/L (ref 134–144)

## 2018-06-06 LAB — BASIC METABOLIC PANEL WITH GFR
CO2: 20 mmol/L (ref 20–29)
Calcium: 9.2 mg/dL (ref 8.6–10.2)
Chloride: 110 mmol/L — ABNORMAL HIGH (ref 96–106)
Potassium: 4 mmol/L (ref 3.5–5.2)

## 2018-06-06 LAB — URIC ACID: Uric Acid: 7.8 mg/dL (ref 3.7–8.6)

## 2018-09-04 ENCOUNTER — Other Ambulatory Visit: Payer: Self-pay

## 2018-09-04 ENCOUNTER — Ambulatory Visit (INDEPENDENT_AMBULATORY_CARE_PROVIDER_SITE_OTHER): Payer: Medicare HMO | Admitting: Family Medicine

## 2018-09-04 ENCOUNTER — Encounter: Payer: Self-pay | Admitting: Family Medicine

## 2018-09-04 VITALS — BP 143/91 | HR 95 | Temp 98.0°F | Resp 16 | Ht 66.93 in | Wt 173.0 lb

## 2018-09-04 DIAGNOSIS — M542 Cervicalgia: Secondary | ICD-10-CM | POA: Diagnosis not present

## 2018-09-04 DIAGNOSIS — M109 Gout, unspecified: Secondary | ICD-10-CM

## 2018-09-04 MED ORDER — PREDNISONE 10 MG PO TABS: ORAL_TABLET | ORAL | 0 refills | Status: DC

## 2018-09-04 NOTE — Progress Notes (Signed)
10/1/20192:54 PM  Earl Riley November 06, 1928, 84 y.o. male [id number]  Chief Complaint  Patient presents with  . Neck Pain    pt states he has had this neck pain x 3 days that radiates down to his shoulder.     HPI:   Patient is a 84 y.o. male with past medical history significant for HTN, gout, HLP, hypothyroidism who presents today for neck pain  Has been having neck pain for past 3 days Pain was so intense that he could not move neck side to side Pain was radiating down into his shoulder When he was a teenager he injured his neck Today feeling better No numbness or tingling in his hands No focal weakness Put some capascian and biofreeze on neck, taking tylenol, helped some Denies any loss of control of bowel or bladder Unable to do ibuprofen due to kidney crt 1.35, GFR 54, July 2019  Also having gout flare up on has hands for about a week Very swollen joints Requesting refill of prednisone Last uric acid 7.8 in July 2019  Patient Care Team: Fleet Contras, MD as PCP - General (Internal Medicine)   Cervical spine xray 2010 - extensive DDD  Fall Risk  09/04/2018 06/05/2018 05/11/2018 05/08/2018  Falls in the past year? No No Yes No  Comment - - fell in August and fractured left hip -     Depression screen [facility] 2/9 09/04/2018 06/05/2018 05/11/2018  Decreased Interest 0 0 0  Down, Depressed, Hopeless 0 0 0  PHQ - 2 Score 0 0 0    No Known Allergies  Prior to Admission medications   Medication Sig Start Date End Date Taking? Authorizing Provider  amLODipine (NORVASC) 5 MG tablet Take 1 tablet (5 mg total) by mouth daily. 08/22/15  Yes Joseph Art, DO  aspirin EC 81 MG tablet Take 81 mg by mouth daily.   Yes [provider]  diclofenac sodium (VOLTAREN) 1 % GEL Apply 2 g topically 4 (four) times daily. 04/02/18  Yes Law, Waylan Boga, PA-C  hydrOXYzine (ATARAX/VISTARIL) 25 MG tablet Take 1 tablet (25 mg total) by mouth every 8 (eight) hours as needed for itching.  05/08/18  Yes McVey, Madelaine Bhat, PA-C  levothyroxine (SYNTHROID, LEVOTHROID) 100 MCG tablet Take 100 mcg by mouth daily. 08/11/15  Yes [provider]  lisinopril (PRINIVIL,ZESTRIL) 10 MG tablet Take 1 tablet (10 mg total) by mouth daily. 08/22/15  Yes Vann, Jessica U, DO  simvastatin (ZOCOR) 40 MG tablet Take 40 mg by mouth daily. 07/08/15  Yes [provider]    Past Medical History:  Diagnosis Date  . Gout   . Hypercholesteremia   . Hypertension   . Thyroid disease     Past Surgical History:  Procedure Laterality Date  . ABDOMINAL SURGERY     aneurism repair    Social History   Tobacco Use  . Smoking status: Former Games developer  . Smokeless tobacco: Never Used  Substance Use Topics  . Alcohol use: No    Family History  Family history unknown: Yes    ROS Per hpi  OBJECTIVE:  Blood pressure (!) 143/91, pulse 95, temperature 98 F (36.7 C), temperature source Oral, resp. rate 16, height 5' 6.93" (1.7 m), weight 173 lb (78.5 kg), SpO2 98 %. Body mass index is 27.15 kg/m.   Physical Exam  Constitutional: He is oriented to person, place, and time. He appears well-developed and well-nourished.  HENT:  Head: Normocephalic and atraumatic.  Mouth/Throat:  Oropharynx is clear and moist.  Eyes: Pupils are equal, round, and reactive to light. Conjunctivae and EOM are normal.  Neck: Neck supple. Muscular tenderness (bilateral paraspinals) present. No spinous process tenderness present. No neck rigidity. Decreased range of motion (extension and lateral rotation) present.  Pulmonary/Chest: Effort normal.  Musculoskeletal:       Left hand: He exhibits tenderness, deformity and swelling (2nd and 3rd MCP joints, TTP).  Neurological: He is alert and oriented to person, place, and time.  Skin: Skin is warm and dry.  Psychiatric: He has a normal mood and affect.  Nursing note and vitals reviewed.    ASSESSMENT and PLAN  1. Bilateral neck pain Discuss supportive  measures, prednisone should help, can also use topical diclofenac.   2. Acute gout of left hand, unspecified cause Treating another flareup, sign tophus. Advised discussing prophylactic medication with PCP - predniSONE (DELTASONE) 10 MG tablet; Take 4 pills x 5 days, then take 3 pills x 5 days, then take 2 pills x 5 days, then take 1 pill x 5 days.  Return if symptoms worsen or fail to improve.    Myles Lipps, MD Primary Care at [facility] [address] [place], [place] [address] Ph.  [phone] Fax [phone]

## 2018-09-04 NOTE — Patient Instructions (Addendum)
consider using diclofenc gel on neck muscles as needed Also do trial of cold compresses alternating with heat   If you have lab work done today you will be contacted with your lab results within the next 2 weeks.  If you have not heard from [place] then please contact us. The fastest way to get your results is to register for My Chart.   IF you received an x-ray today, you will receive an invoice from [facility]. Please contact [facility] at [phone] with questions or concerns regarding your invoice.   IF you received labwork today, you will receive an invoice from [place]. Please contact [facility] at [phone] with questions or concerns regarding your invoice.   Our billing staff will not be able to assist you with questions regarding bills from these companies.  You will be contacted with the lab results as soon as they are available. The fastest way to get your results is to activate your My Chart account. Instructions are located on the last page of this paperwork. If you have not heard from [place] regarding the results in 2 weeks, please contact this office.      Muscle Pain, Adult Muscle pain (myalgia) may be mild or severe. In most cases, the pain lasts only a short time and it goes away without treatment. It is normal to feel some muscle pain after starting a workout program. Muscles that have not been used often will be sore at first. Muscle pain may also be caused by many other things, including:  Overuse or muscle strain, especially if you are not in shape. This is the most common cause of muscle pain.  Injury.  Bruises.  Viruses, such as the flu.  Infectious diseases.  A chronic condition that causes muscle tenderness, fatigue, and headache (fibromyalgia).  A condition, such as lupus, in which the body's disease-fighting system attacks other organs in the body (autoimmune or rheumatologic diseases).  Certain drugs, including ACE inhibitors and  statins.  To diagnose the cause of your muscle pain, your health care provider will do a physical exam and ask questions about the pain and when it began. If you have not had muscle pain for very long, your health care provider may want to wait before doing much testing. If your muscle pain has lasted a long time, your health care provider may want to run tests right away. In some cases, this may include tests to rule out certain conditions or illnesses. Treatment for muscle pain depends on the cause. Home care is often enough to relieve muscle pain. Your health care provider may also prescribe anti-inflammatory medicine. Follow these instructions at home: Activity  If overuse is causing your muscle pain: ? Slow down your activities until the pain goes away. ? Do regular, gentle exercises if you are not usually active. ? Warm up before exercising. Stretch before and after exercising. This can help lower the risk of muscle pain.  Do not continue working out if the pain is very bad. Bad pain could mean that you have injured a muscle. Managing pain and discomfort   If directed, apply ice to the sore muscle: ? Put ice in a plastic bag. ? Place a towel between your skin and the bag. ? Leave the ice on for 20 minutes, 2-3 times a day.  You may also alternate between applying ice and applying heat as told by your health care provider. To apply heat, use the heat source that your health care provider recommends,  such as a moist heat pack or a heating pad. ? Place a towel between your skin and the heat source. ? Leave the heat on for 20-30 minutes. ? Remove the heat if your skin turns bright red. This is especially important if you are unable to feel pain, heat, or cold. You may have a greater risk of getting burned. Medicines  Take over-the-counter and prescription medicines only as told by your health care provider.  Do not drive or use heavy machinery while taking prescription pain  medicine. Contact a health care provider if:  Your muscle pain gets worse and medicines do not help.  You have muscle pain that lasts longer than 3 days.  You have a rash or fever along with muscle pain.  You have muscle pain after a tick bite.  You have muscle pain while working out, even though you are in good physical condition.  You have redness, soreness, or swelling along with muscle pain.  You have muscle pain after starting a new medicine or changing the dose of a medicine. Get help right away if:  You have trouble breathing.  You have trouble swallowing.  You have muscle pain along with a stiff neck, fever, and vomiting.  You have severe muscle weakness or cannot move part of your body. This information is not intended to replace advice given to you by your health care provider. Make sure you discuss any questions you have with your health care provider. Document Released: 10/13/2006 Document Revised: 06/10/2016 Document Reviewed: 04/12/2016 Elsevier Interactive Patient Education  2018 [employer].

## 2018-10-26 ENCOUNTER — Encounter: Payer: Self-pay | Admitting: *Deleted

## 2019-01-05 IMAGING — DX DG HAND COMPLETE 3+V*L*
3 series · 3 of 3 positions shown · non-contrast
Comparison: Plain film of the LEFT hand dated 04/02/2017.

CLINICAL DATA: LEFT hand swelling.  History of gout.

EXAM:
LEFT HAND - COMPLETE 3+ VIEW

[hand pa]
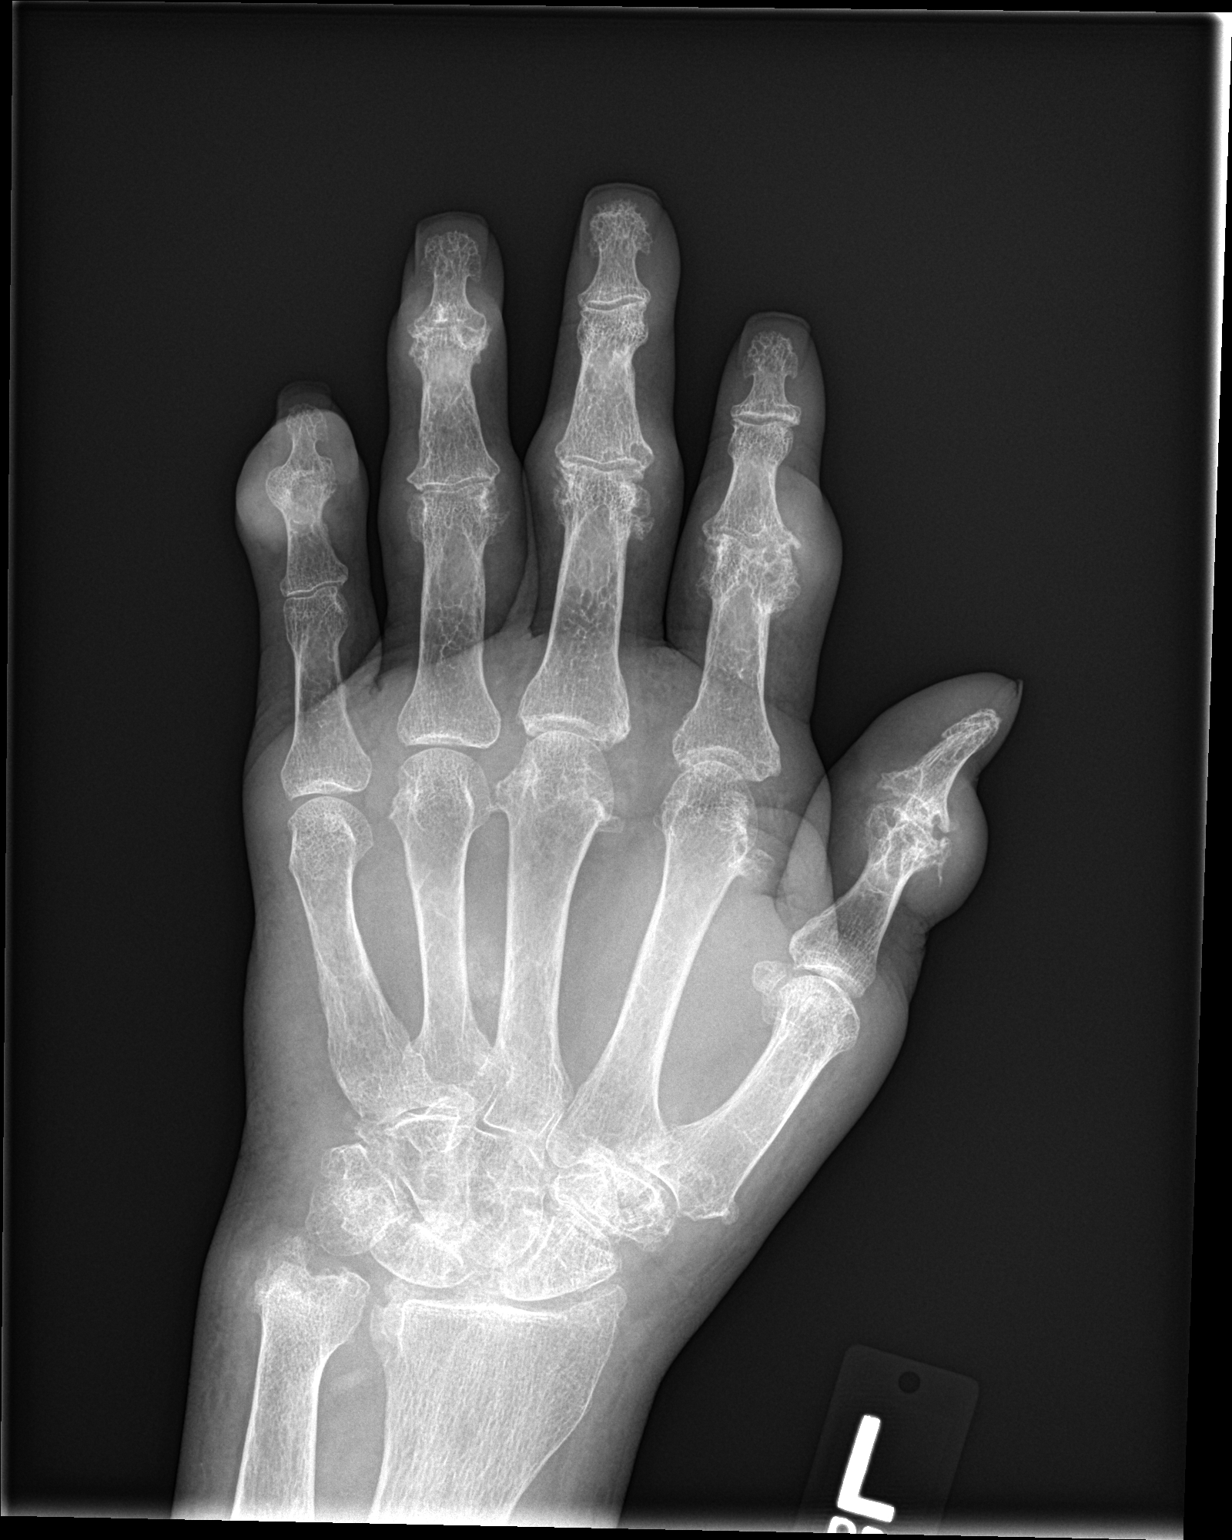

[hand obl]
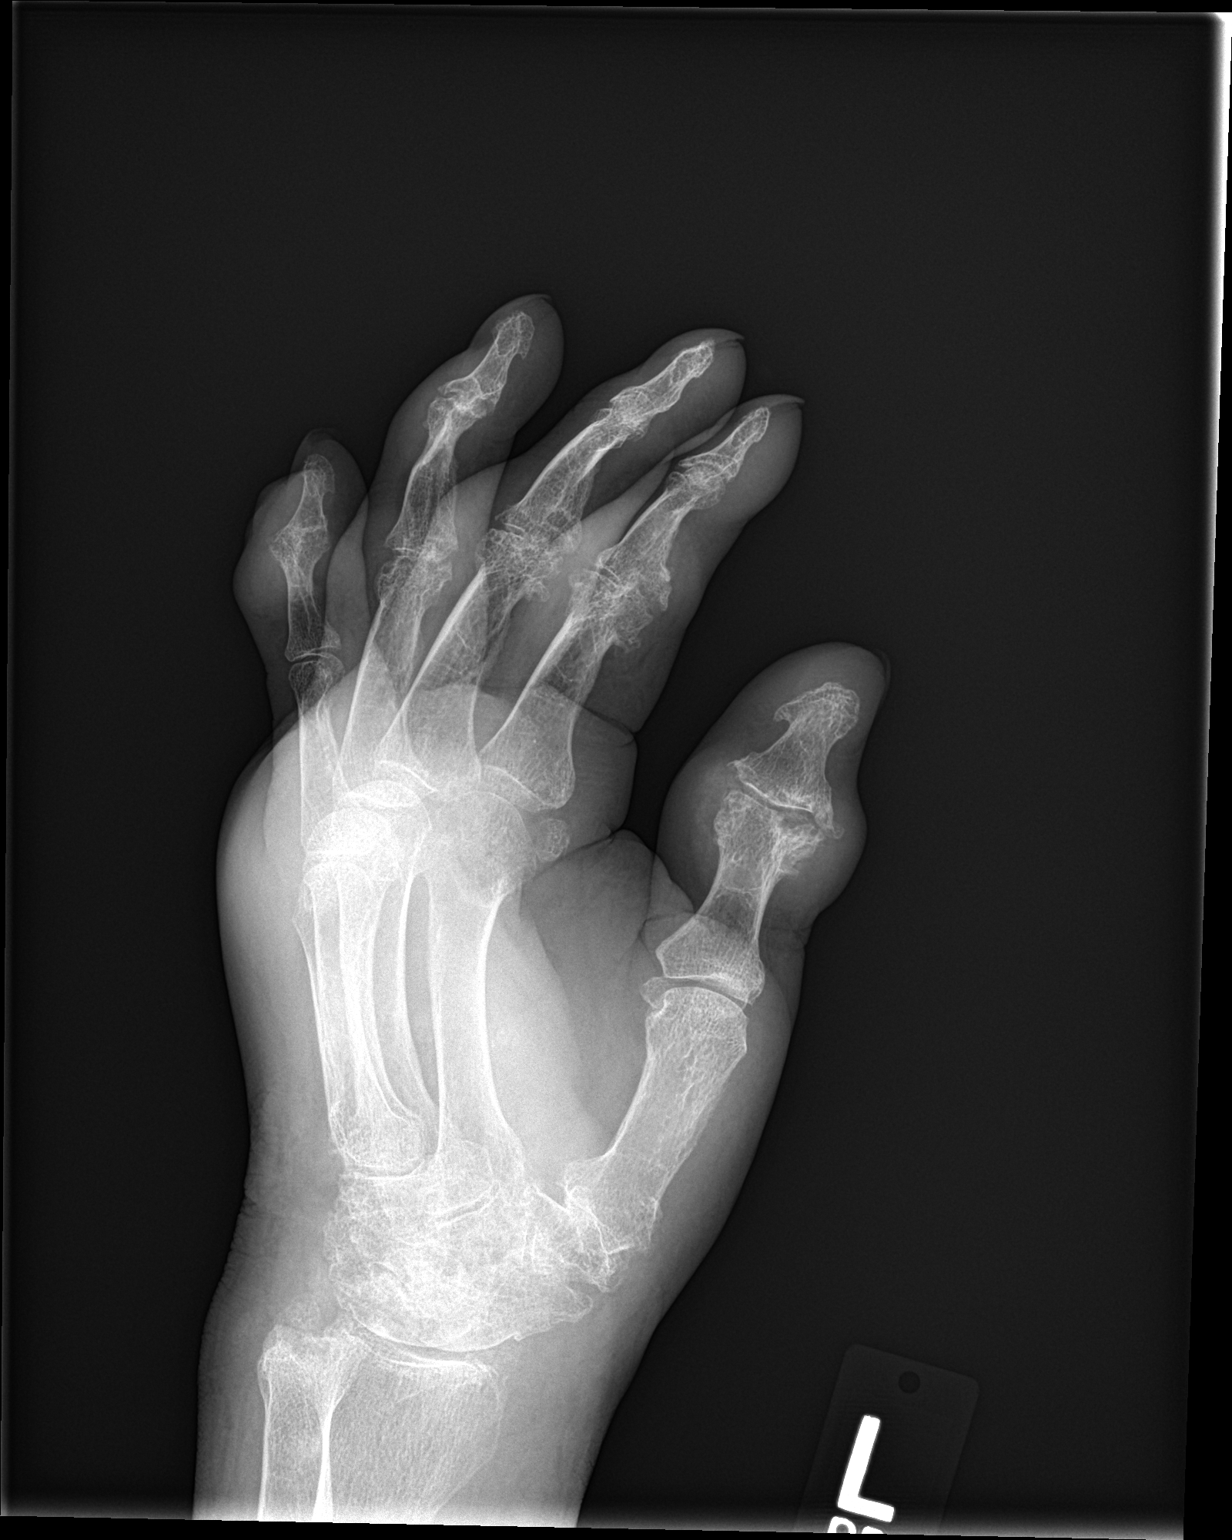

[hand lat]
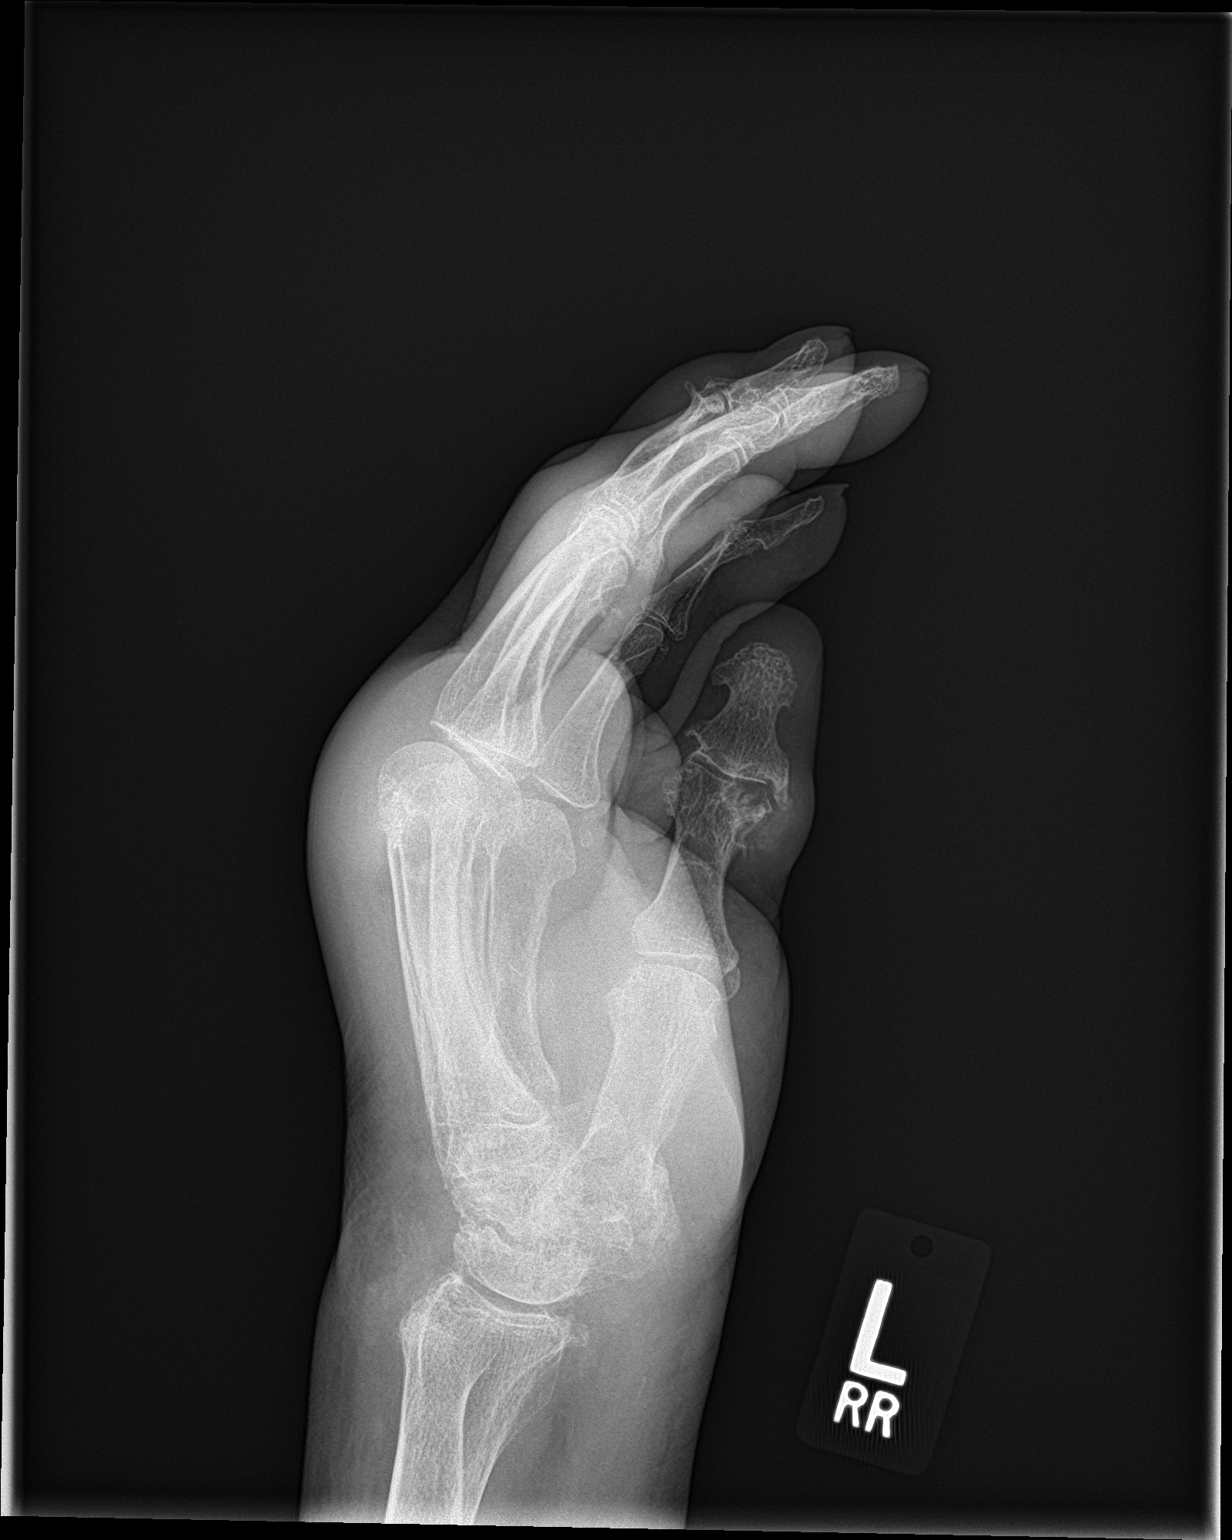

[3 of 3 positions shown; findings below may reference images not displayed]

FINDINGS: No acute appearing osseous abnormality. Multifocal periarticular
erosive changes are again seen, stable, again most prominent at the
first IP joint, second PIP joint, third PIP joint, fourth DIP joint
and fifth DIP joint. Associated gouty tophus appreciated at each
site. Similar erosive changes are seen throughout the carpal bones,
stable.
IMPRESSION: 1. No acute findings.
2. Stable appearance of the multifocal periarticular erosions and
gouty tophus, as detailed above.

## 2019-05-15 ENCOUNTER — Encounter: Payer: Self-pay | Admitting: Registered Nurse

## 2019-05-15 ENCOUNTER — Ambulatory Visit (INDEPENDENT_AMBULATORY_CARE_PROVIDER_SITE_OTHER): Payer: Medicare Other | Admitting: Registered Nurse

## 2019-05-15 ENCOUNTER — Other Ambulatory Visit: Payer: Self-pay

## 2019-05-15 VITALS — BP 100/68 | HR 89 | Temp 97.7°F | Resp 18 | Ht 66.0 in | Wt 180.0 lb

## 2019-05-15 DIAGNOSIS — M109 Gout, unspecified: Secondary | ICD-10-CM | POA: Diagnosis not present

## 2019-05-15 DIAGNOSIS — M1990 Unspecified osteoarthritis, unspecified site: Secondary | ICD-10-CM | POA: Diagnosis not present

## 2019-05-15 DIAGNOSIS — M151 Heberden's nodes (with arthropathy): Secondary | ICD-10-CM

## 2019-05-15 DIAGNOSIS — R2232 Localized swelling, mass and lump, left upper limb: Secondary | ICD-10-CM

## 2019-05-15 MED ORDER — COLCHICINE 0.6 MG PO TABS: ORAL_TABLET | ORAL | 0 refills | Status: AC

## 2019-05-15 NOTE — Patient Instructions (Signed)
° ° ° °  If you have lab work done today you will be contacted with your lab results within the next 2 weeks.  If you have not heard from us then please contact us. The fastest way to get your results is to register for My Chart. ° ° °IF you received an x-ray today, you will receive an invoice from [facility]. Please contact  Radiology at [phone] with questions or concerns regarding your invoice.  ° °IF you received labwork today, you will receive an invoice from [facility]. Please contact [facility] at [phone] with questions or concerns regarding your invoice.  ° °Our billing staff will not be able to assist you with questions regarding bills from these companies. ° °You will be contacted with the lab results as soon as they are available. The fastest way to get your results is to activate your My Chart account. Instructions are located on the last page of this paperwork. If you have not heard from us regarding the results in 2 weeks, please contact this office. °  ° ° ° °

## 2019-05-15 NOTE — Progress Notes (Signed)
Acute Office Visit  Subjective:    Patient ID: Earl Riley, male    DOB: Jul 02, 1928, 85 y.o.   MRN: [id number]  Chief Complaint  Patient presents with  . Gout    pain and swelling in the right hand x 3 weeks.     HPI Patient is in today for acute gout of both hands  Pt noticed 3 weeks prior to visit. He has had luck with colchicine in the past, though reports his current PCP was hesitant to put him on colchicine again. He has had issues with kidney function in the past - mildly elevated creatinine. We will recheck this today.   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 These are complicated by a significant amount of gouty swelling. He denies pain, endorses weakness and limited ROM.  He has not been tested for RF in the past so far as I can see, but will test today. Needs Rheum referral.  Past Medical History:  Diagnosis Date  . Gout   . Hypercholesteremia   . Hypertension   . Thyroid disease     Past Surgical History:  Procedure Laterality Date  . ABDOMINAL SURGERY     aneurism repair    Family History  Family history unknown: Yes    Social History   Socioeconomic History  . Marital status: Widowed    Spouse name: Not on file  . Number of children: Not on file  . Years of education: Not on file  . Highest education level: Not on file  Occupational History  . Not on file  Social Needs  . Financial resource strain: Not on file  . Food insecurity:    Worry: Not on file    Inability: Not on file  . Transportation needs:    Medical: Not on file    Non-medical: Not on file  Tobacco Use  . Smoking status: Former Games developermoker  . Smokeless tobacco: Never Used  Substance and Sexual Activity  . Alcohol use: No  . Drug use: Yes    Types: Marijuana    Comment: Last used: Last Sunday   . Sexual activity: Not on file  Lifestyle  . Physical activity:    Days per week: Not on file    Minutes per session: Not on file  . Stress: Not on file   Relationships  . Social connections:    Talks on phone: Not on file    Gets together: Not on file    Attends religious service: Not on file    Active member of club or organization: Not on file    Attends meetings of clubs or organizations: Not on file    Relationship status: Not on file  . Intimate partner violence:    Fear of current or ex partner: Not on file    Emotionally abused: Not on file    Physically abused: Not on file    Forced sexual activity: Not on file  Other Topics Concern  . Not on file  Social History Narrative  . Not on file    Outpatient Medications Prior to Visit  Medication Sig Dispense Refill  . amLODipine (NORVASC) 5 MG tablet Take 1 tablet (5 mg total) by mouth daily. 30 tablet 0  . aspirin EC 81 MG tablet Take 81 mg by mouth daily.    . diclofenac sodium (VOLTAREN) 1 % GEL Apply 2 g topically 4 (four) times daily. 100 g 0  . hydrOXYzine (ATARAX/VISTARIL) 25 MG tablet  Take 1 tablet (25 mg total) by mouth every 8 (eight) hours as needed for itching. 30 tablet 0  . levothyroxine (SYNTHROID, LEVOTHROID) 100 MCG tablet Take 100 mcg by mouth daily.    Marland Kitchen. lisinopril (PRINIVIL,ZESTRIL) 10 MG tablet Take 1 tablet (10 mg total) by mouth daily. 30 tablet 0  . simvastatin (ZOCOR) 40 MG tablet Take 40 mg by mouth daily.    . predniSONE (DELTASONE) 10 MG tablet Take 4 pills x 5 days, then take 3 pills x 5 days, then take 2 pills x 5 days, then take 1 pill x 5 days. 50 tablet 0   No facility-administered medications prior to visit.     No Known Allergies  ROS     Objective:    Physical Exam  Constitutional: He is oriented to person, place, and time. He appears well-developed and well-nourished.  HENT:  Head: Normocephalic and atraumatic.  Cardiovascular: Normal rate, regular rhythm and normal heart sounds.  Pulmonary/Chest: Effort normal and breath sounds normal. No respiratory distress. He has no wheezes. He has no rales. He exhibits no tenderness.   Abdominal: Soft. Bowel sounds are normal.  Neurological: He is alert and oriented to person, place, and time.  Skin: Skin is warm and dry.     Psychiatric: He has a normal mood and affect. His behavior is normal. Judgment and thought content normal.    BP 100/68   Pulse 89   Temp 97.7 F (36.5 C) (Oral)   Resp 18   Ht 5\' 6"  (1.676 m)   Wt 180 lb (81.6 kg)   SpO2 94%   BMI 29.05 kg/m  Wt Readings from Last 3 Encounters:  05/15/19 180 lb (81.6 kg)  09/04/18 173 lb (78.5 kg)  06/05/18 181 lb 6.4 oz (82.3 kg)    Health Maintenance Due  Topic Date Due  . TETANUS/TDAP  12/14/1948  . PNA vac Low Risk Adult (1 of 2 - PCV13) 12/14/1994    There are no preventive care reminders to display for this patient.   No results found for: TSH Lab Results  Component Value Date   WBC 6.3 08/21/2015   HGB 13.1 08/21/2015   HCT 40.0 08/21/2015   MCV 94.6 08/21/2015   PLT 154 08/21/2015   Lab Results  Component Value Date   NA 144 06/05/2018   K 4.0 06/05/2018   CO2 20 06/05/2018   GLUCOSE 89 06/05/2018   BUN 13 06/05/2018   CREATININE 1.35 (H) 06/05/2018   BILITOT 1.1 03/17/2008   ALKPHOS 94 03/17/2008   AST 27 03/17/2008   ALT 22 03/17/2008   PROT 8.4 (H) 03/17/2008   ALBUMIN 3.8 03/17/2008   CALCIUM 9.2 06/05/2018   ANIONGAP 7 08/21/2015   Lab Results  Component Value Date   CHOL 160 12/11/2017   No results found for: HDL No results found for: LDLCALC No results found for: TRIG No results found for: CHOLHDL No results found for: [id number]     Assessment & Plan:   Problem List Items Addressed This Visit    None    Visit Diagnoses    Acute gout of left hand, unspecified cause    -  Primary   Relevant Medications   colchicine 0.6 MG tablet   Other Relevant Orders   Comprehensive metabolic panel   CBC with Differential   Uric Acid   Localized swelling on left hand       Relevant Orders   Comprehensive metabolic panel   Rheumatoid factor  CBC with  Differential   Uric Acid   Heberden node       Relevant Orders   Rheumatoid factor   Ambulatory referral to Rheumatology   Arthritis       Relevant Medications   colchicine 0.6 MG tablet   Other Relevant Orders   Ambulatory referral to Rheumatology       Meds ordered this encounter  Medications  . colchicine 0.6 MG tablet    Sig: Take 1 tablet (0.6 mg total) by mouth 2 (two) times a day for 1 day, THEN 1 tablet (0.6 mg total) daily for 6 days.    Dispense:  8 tablet    Refill:  0    Order Specific Question:   Supervising Provider    Answer:   Forrest Moron [id number]    PLAN:  Colchicine: twice today, once daily for the next 6 days.   Follow up with Rheum  Will follow up with labs as necessary.  Patient encouraged to call clinic with any questions, comments, or concerns.   Maximiano Coss, NP

## 2019-05-16 LAB — COMPREHENSIVE METABOLIC PANEL
ALT: 18 IU/L (ref 0–44)
AST: 22 IU/L (ref 0–40)
Albumin/Globulin Ratio: 0.8 — ABNORMAL LOW (ref 1.2–2.2)
Albumin: 3.4 g/dL — ABNORMAL LOW (ref 3.6–4.6)
Alkaline Phosphatase: 67 IU/L (ref 39–117)
BUN/Creatinine Ratio: 19 (ref 10–24)
BUN: 42 mg/dL — ABNORMAL HIGH (ref 8–27)
Bilirubin Total: 0.5 mg/dL (ref 0.0–1.2)
CO2: 18 mmol/L — ABNORMAL LOW (ref 20–29)
Calcium: 9.4 mg/dL (ref 8.6–10.2)
Chloride: 104 mmol/L (ref 96–106)
Creatinine, Ser: 2.26 mg/dL — ABNORMAL HIGH (ref 0.76–1.27)
GFR calc Af Amer: 29 mL/min/{1.73_m2} — ABNORMAL LOW (ref >59)
GFR calc non Af Amer: 25 mL/min/{1.73_m2} — ABNORMAL LOW (ref >59)
Globulin, Total: 4.1 g/dL (ref 1.5–4.5)
Glucose: 86 mg/dL (ref 65–99)
Potassium: 5.2 mmol/L (ref 3.5–5.2)
Sodium: 136 mmol/L (ref 134–144)
Total Protein: 7.5 g/dL (ref 6.0–8.5)

## 2019-05-16 LAB — CBC WITH DIFFERENTIAL/PLATELET
Basophils Absolute: 0 10*3/uL (ref 0.0–0.2)
Basos: 1 %
EOS (ABSOLUTE): 0.2 10*3/uL (ref 0.0–0.4)
Eos: 3 %
Hematocrit: 35.8 % — ABNORMAL LOW (ref 37.5–51.0)
Hemoglobin: 11.5 g/dL — ABNORMAL LOW (ref 13.0–17.7)
Immature Grans (Abs): 0 10*3/uL (ref 0.0–0.1)
Immature Granulocytes: 0 %
Lymphocytes Absolute: 2.2 10*3/uL (ref 0.7–3.1)
Lymphs: 27 %
MCH: 30 pg (ref 26.6–33.0)
MCHC: 32.1 g/dL (ref 31.5–35.7)
MCV: 94 fL (ref 79–97)
Monocytes Absolute: 0.7 10*3/uL (ref 0.1–0.9)
Monocytes: 8 %
Neutrophils Absolute: 5.1 10*3/uL (ref 1.4–7.0)
Neutrophils: 61 %
Platelets: 265 10*3/uL (ref 150–450)
RBC: 3.83 x10E6/uL — ABNORMAL LOW (ref 4.14–5.80)
RDW: 12.7 % (ref 11.6–15.4)
WBC: 8.3 10*3/uL (ref 3.4–10.8)

## 2019-05-16 LAB — URIC ACID: Uric Acid: 9.8 mg/dL — ABNORMAL HIGH (ref 3.7–8.6)

## 2019-05-16 LAB — RHEUMATOID FACTOR: Rheumatoid fact SerPl-aCnc: <10 IU/mL (ref 0.0–13.9)

## 2019-05-17 ENCOUNTER — Telehealth: Payer: Self-pay | Admitting: Registered Nurse

## 2019-05-17 DIAGNOSIS — N189 Chronic kidney disease, unspecified: Secondary | ICD-10-CM

## 2019-05-17 NOTE — Telephone Encounter (Signed)
Called patient to discuss results / current medications. Left message asking for him to return the call.  Should half dose of colchicine -  Pt has decreased GFR, increased creatinine, decreased counts on CBC, indicating a possible renal issue - he will be referred to nephrology.  Kathrin Ruddy, NP

## 2019-05-20 ENCOUNTER — Telehealth: Payer: Self-pay | Admitting: Registered Nurse

## 2019-05-20 NOTE — Telephone Encounter (Signed)
Copied from [place] [phone]. Topic: General - Other >> May 17, 2019  1:38 PM Gustavus Messing wrote: Reason for CRM: The patient called wanting to speak with NP Orland Mustard because he had  stated that he had information to give the patient. Please call back [phone].

## 2019-05-22 NOTE — Telephone Encounter (Signed)
Pt returning your call and requesting call back at [phone]. Dgaddy, CMA

## 2019-06-11 ENCOUNTER — Other Ambulatory Visit: Payer: Self-pay | Admitting: Nephrology

## 2019-06-11 DIAGNOSIS — N179 Acute kidney failure, unspecified: Secondary | ICD-10-CM

## 2019-06-13 ENCOUNTER — Ambulatory Visit
Admission: RE | Admit: 2019-06-13 | Discharge: 2019-06-13 | Disposition: A | Discharge status: Home / Self Care | Payer: Medicare HMO | Source: Ambulatory Visit | Attending: Nephrology | Admitting: Nephrology

## 2019-06-13 DIAGNOSIS — N179 Acute kidney failure, unspecified: Secondary | ICD-10-CM

## 2019-11-05 DEATH — deceased

## 2020-02-10 IMAGING — US US RENAL
1 series · 14 of 25 positions shown · non-contrast
Comparison: Abdominal CTA August 21, 2015

CLINICAL DATA: Acute kidney injury superimposed on CKD

EXAM:
RENAL / URINARY TRACT ULTRASOUND COMPLETE

[Series 1: us renal · 0.25mm/px · 14 of 47 slices shown]
[im 1/47]
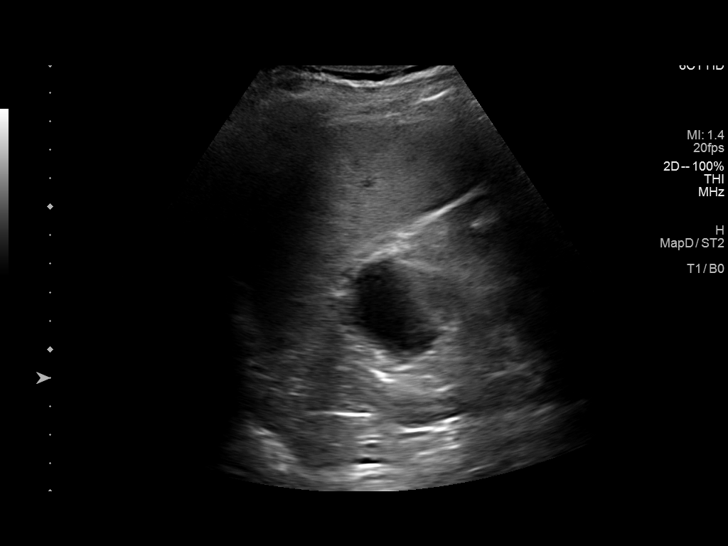
[im 4/47]
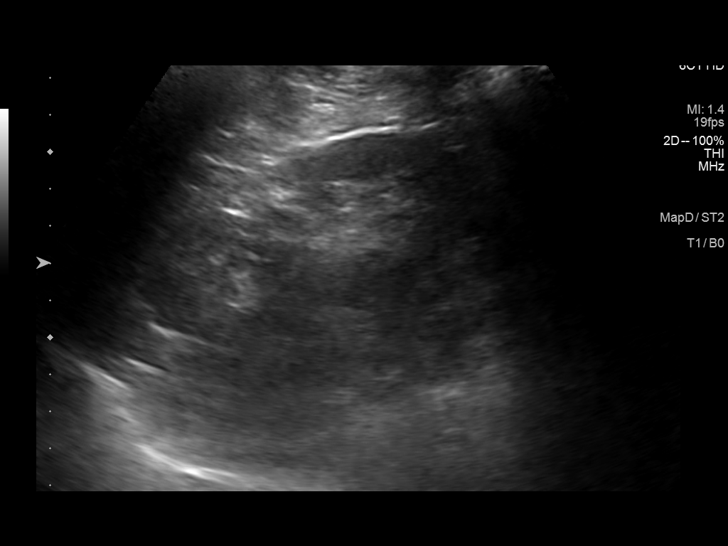
[im 8/47]
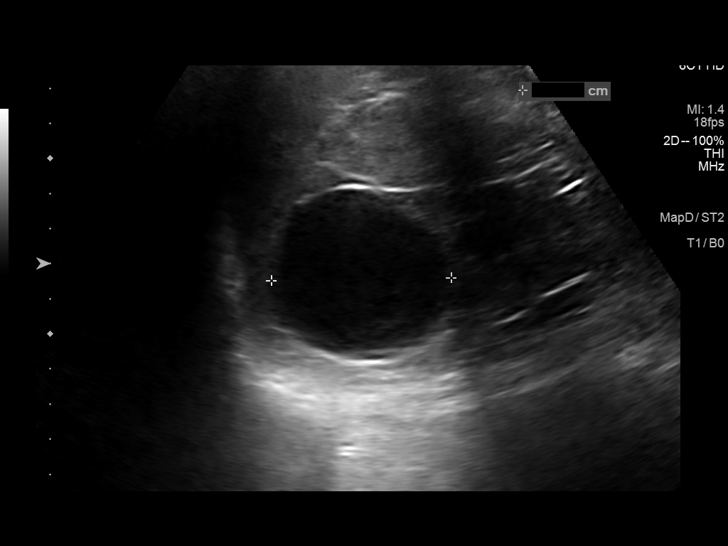
[im 12/47]
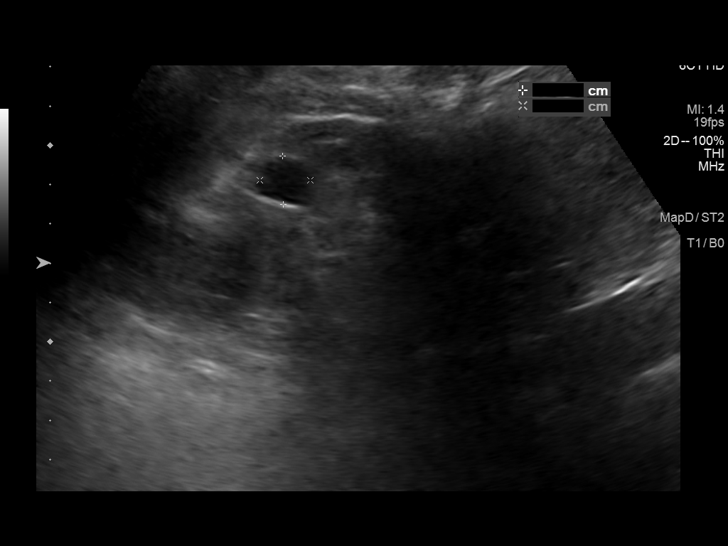
[im 16/47]
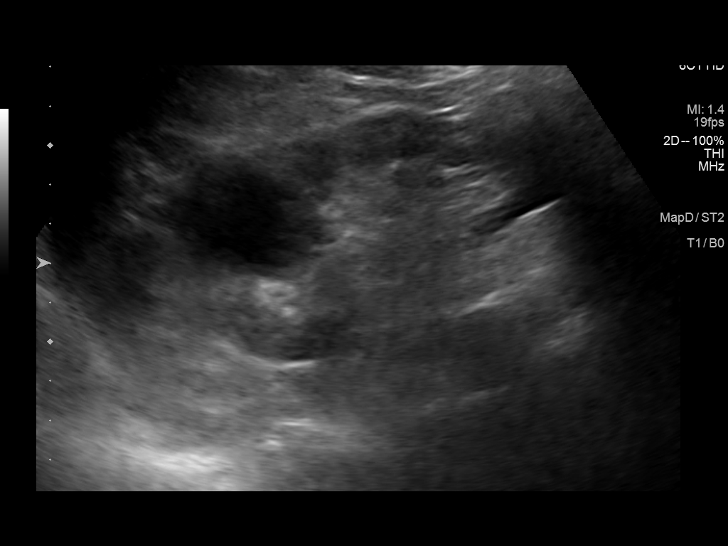
[im 18/47]
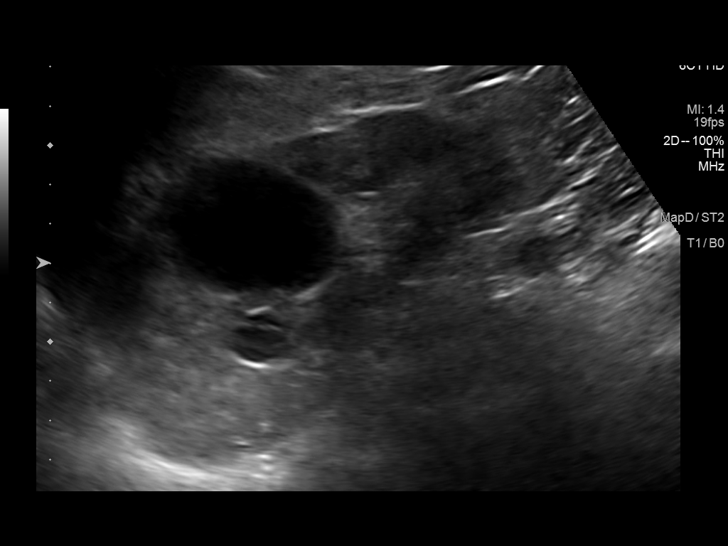
[im 22/47]
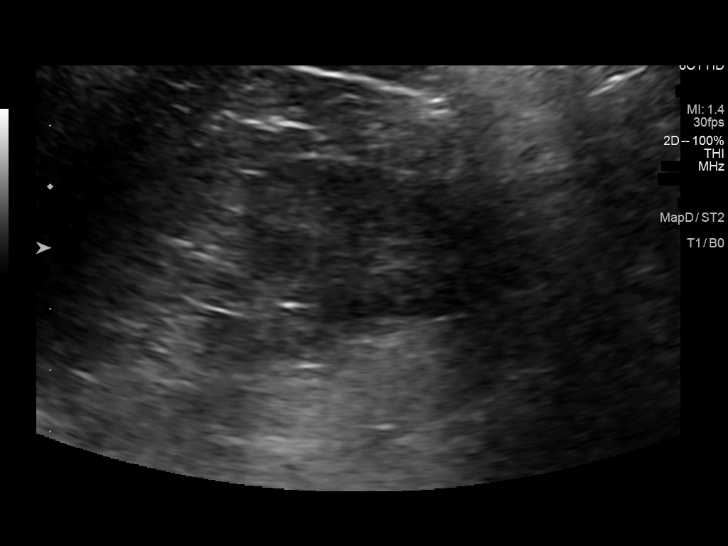
[im 25/47]
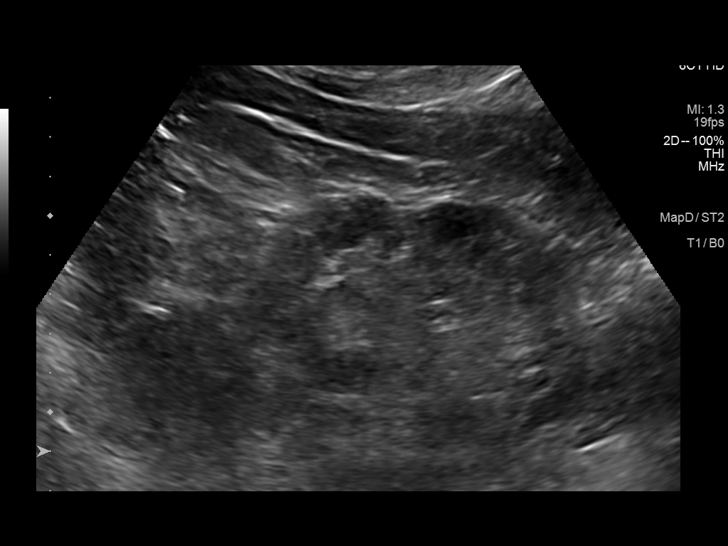
[im 29/47]
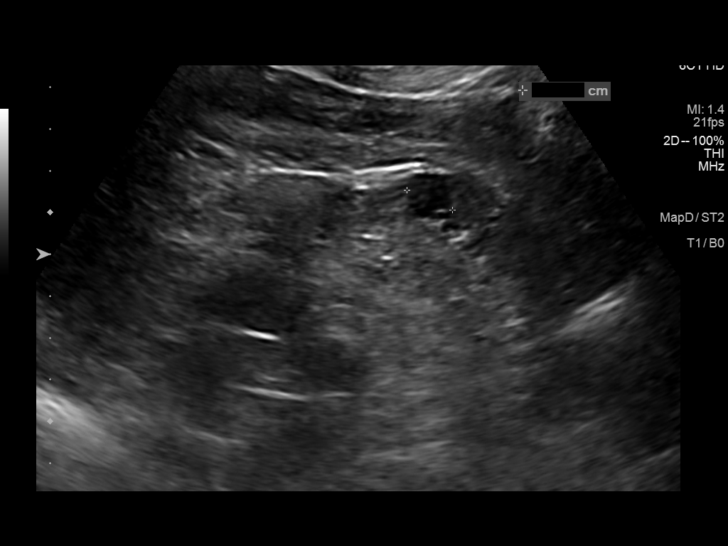
[im 31/47]
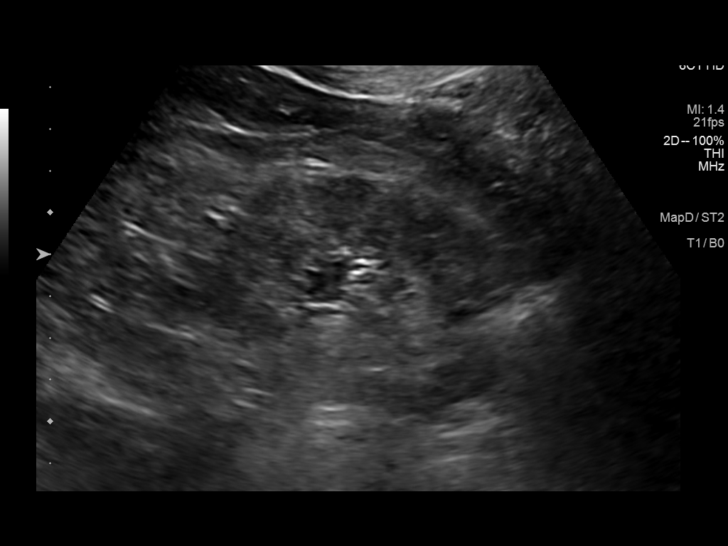
[im 35/47]
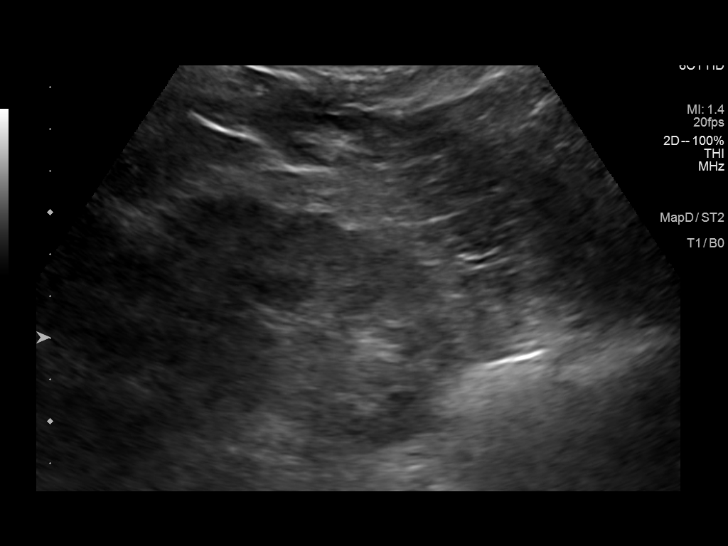
[im 39/47]
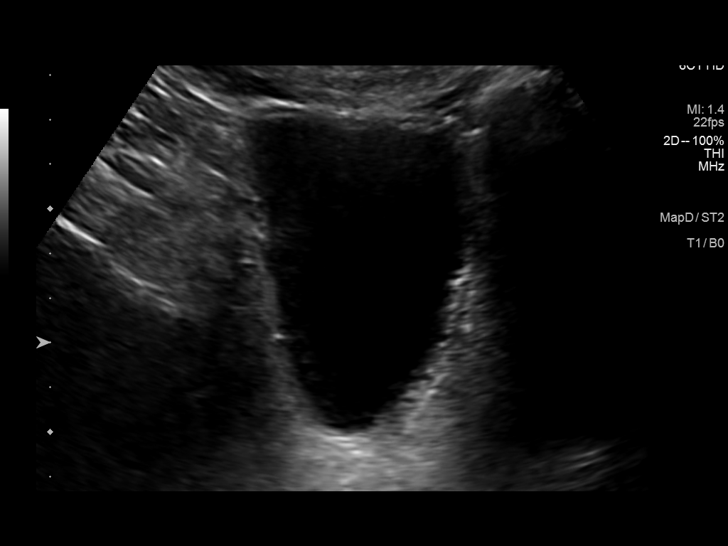
[im 43/47]
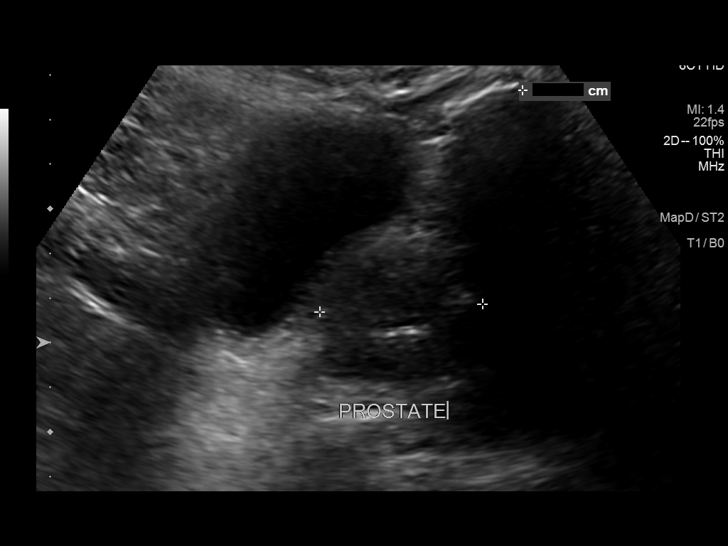
[im 47/47]
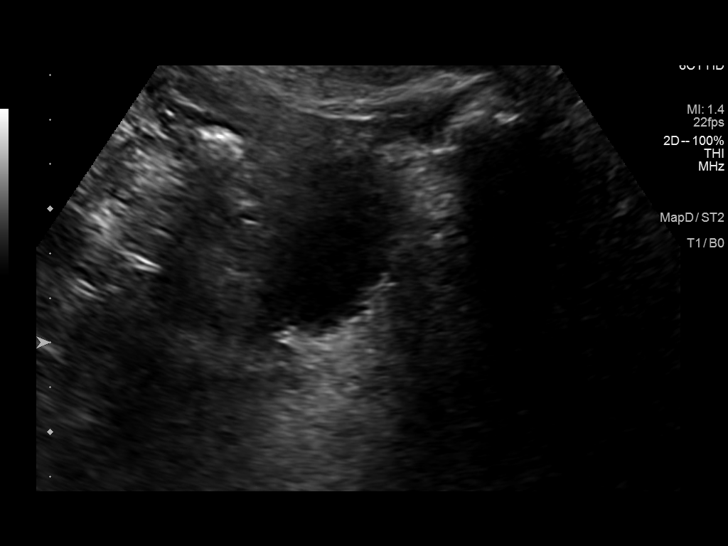

[14 of 25 positions shown; findings below may reference images not displayed]

FINDINGS: Right Kidney:

Renal measurements: 9.4 x 4.2 x 5.0 = volume: 104 mL. Increased
cortical echogenicity. Few cortical cysts. Largest in the upper pole
measuring 5.1 x 4.8 x 5.6 cm, previously 4 x 4 by 4.2 cm on prior CT
angiography 08/21/2015. Additional 1.7 x 1.2 x 1.3 cm cyst in the
interpolar region. No concerning features of either cyst.

Left Kidney:

Renal measurements: 9.1 x 5.5 x 4.5 cm = volume: 115 mL. Increased
cortical echogenicity. 1.2 x 1.2 x 1.1 cm cyst in the interpolar
region left kidney. Likely present on prior exam no concerning
internal features. Additional cysts seen previously not visualized
on today's study.

Bladder:

Appears normal for degree of bladder distention.

Prostate:

Prostate measures 3.7 x 3.3 x 3.7 cm = volume 23 mL, within expected
normal.
IMPRESSION: No acute abnormality.

Increased cortical echogenicity compatible with known medical renal
disease.

Bilateral renal cysts, largest in the right kidney slightly enlarged
since 5075. No other concerning cyst features.

## 2020-12-25 NOTE — Telephone Encounter (Signed)
error
